# Patient Record
Sex: Male | Born: 1990
Health system: Southern US, Community
[De-identification: ages and names within clinical notes are randomized; demographics above are authoritative.]

## PROBLEM LIST (undated history)

## (undated) DIAGNOSIS — I10 Essential (primary) hypertension: Secondary | ICD-10-CM

## (undated) DIAGNOSIS — R600 Localized edema: Secondary | ICD-10-CM

## (undated) DIAGNOSIS — G4733 Obstructive sleep apnea (adult) (pediatric): Secondary | ICD-10-CM

## (undated) DIAGNOSIS — M549 Dorsalgia, unspecified: Secondary | ICD-10-CM

## (undated) DIAGNOSIS — K760 Fatty (change of) liver, not elsewhere classified: Secondary | ICD-10-CM

## (undated) DIAGNOSIS — Z9989 Dependence on other enabling machines and devices: Secondary | ICD-10-CM

## (undated) HISTORY — DX: Dorsalgia, unspecified: M54.9

## (undated) HISTORY — DX: Localized edema: R60.0

## (undated) HISTORY — DX: Essential (primary) hypertension: I10

## (undated) HISTORY — DX: Fatty (change of) liver, not elsewhere classified: K76.0

## (undated) HISTORY — DX: Obstructive sleep apnea (adult) (pediatric): G47.33

## (undated) HISTORY — PX: APPENDECTOMY: SHX54

## (undated) HISTORY — DX: Dependence on other enabling machines and devices: Z99.89

## (undated) HISTORY — DX: Morbid (severe) obesity due to excess calories: E66.01

---

## 2015-01-11 ENCOUNTER — Encounter (HOSPITAL_BASED_OUTPATIENT_CLINIC_OR_DEPARTMENT_OTHER): Payer: Self-pay | Admitting: *Deleted

## 2015-01-11 ENCOUNTER — Emergency Department (HOSPITAL_BASED_OUTPATIENT_CLINIC_OR_DEPARTMENT_OTHER)
Admission: EM | Admit: 2015-01-11 | Discharge: 2015-01-11 | Disposition: A | Discharge status: Home / Self Care | Payer: BLUE CROSS/BLUE SHIELD | Attending: Emergency Medicine | Admitting: Emergency Medicine

## 2015-01-11 DIAGNOSIS — H9209 Otalgia, unspecified ear: Secondary | ICD-10-CM | POA: Insufficient documentation

## 2015-01-11 DIAGNOSIS — J069 Acute upper respiratory infection, unspecified: Secondary | ICD-10-CM | POA: Insufficient documentation

## 2015-01-11 DIAGNOSIS — R05 Cough: Secondary | ICD-10-CM | POA: Diagnosis present

## 2015-01-11 NOTE — ED Provider Notes (Signed)
CSN: [id number]     Arrival date & time 01/11/15  1236 History   First MD Initiated Contact with Patient 01/11/15 1243     Chief Complaint  Patient presents with  . URI     (Consider location/radiation/quality/duration/timing/severity/associated sxs/prior Treatment) HPI Comments: 24 year old male presenting with gradually worsening nasal congestion, nonproductive cough, itchy throat and bilateral itchy ears 2 days. No fevers. Tried over-the-counter Mucinex with minimal relief. No sick contacts. Took 2 Benadryl with relief of the itchy throat and ears. No aggravating factors.  Patient is a 24 y.o. male presenting with URI. The history is provided by the patient.  URI Presenting symptoms: congestion, cough, ear pain and sore throat     History reviewed. No pertinent past medical history. Past Surgical History  Procedure Laterality Date  . Appendectomy     History reviewed. No pertinent family history. Social History  Substance Use Topics  . Smoking status: Never Smoker   . Smokeless tobacco: None  . Alcohol Use: No    Review of Systems  HENT: Positive for congestion, ear pain and sore throat.   Respiratory: Positive for cough.   All other systems reviewed and are negative.     Allergies  Review of patient's allergies indicates no known allergies.  Home Medications   Prior to Admission medications   Not on File   BP 161/101 mmHg  Pulse 99  Temp(Src) 98.7 F (37.1 C) (Oral)  Resp 20  Ht  (1.626 m)  Wt 260 lb (117.935 kg)  BMI 44.61 kg/m2  SpO2 99% Physical Exam  Constitutional: He is oriented to person, place, and time. He appears well-developed and well-nourished. No distress.  HENT:  Head: Normocephalic and atraumatic.  Nasal congestion, mucosal edema, post nasal drip. BL TM normal.  Eyes: Conjunctivae and EOM are normal.  Neck: Normal range of motion. Neck supple.  Cardiovascular: Normal rate, regular rhythm and normal heart sounds.    Pulmonary/Chest: Effort normal and breath sounds normal.  Musculoskeletal: Normal range of motion. He exhibits no edema.  Lymphadenopathy:    He has no cervical adenopathy.  Neurological: He is alert and oriented to person, place, and time.  Skin: Skin is warm and dry.  Psychiatric: He has a normal mood and affect. His behavior is normal.  Nursing note and vitals reviewed.   ED Course  Procedures (including critical care time) Labs Review Labs Reviewed - No data to display  Imaging Review No results found. I have personally reviewed and evaluated these images and lab results as part of my medical decision-making.   EKG Interpretation None      MDM   Final diagnoses:  URI (upper respiratory infection)   NAD. Afebrile. Lungs clear. Oropharynx clear other than PND. Discussed symptomatic treatment. Stable for d/c. Return precautions given. Patient states understanding of treatment care plan and is agreeable.  Kathrynn Speed, PA-C 01/11/15 1322  Rolan Bucco, MD 01/11/15 1430

## 2015-01-11 NOTE — ED Notes (Signed)
pa at bedside. 

## 2015-01-11 NOTE — Discharge Instructions (Signed)
You may use over-the-counter nasal saline and Flonase. Continue taking Mucinex as needed. Rest and stay well-hydrated.  Upper Respiratory Infection, Adult An upper respiratory infection (URI) is also sometimes known as the common cold. The upper respiratory tract includes the nose, sinuses, throat, trachea, and bronchi. Bronchi are the airways leading to the lungs. Most people improve within 1 week, but symptoms can last up to 2 weeks. A residual cough may last even longer.  CAUSES Many different viruses can infect the tissues lining the upper respiratory tract. The tissues become irritated and inflamed and often become very moist. Mucus production is also common. A cold is contagious. You can easily spread the virus to others by oral contact. This includes kissing, sharing a glass, coughing, or sneezing. Touching your mouth or nose and then touching a surface, which is then touched by another person, can also spread the virus. SYMPTOMS  Symptoms typically develop 1 to 3 days after you come in contact with a cold virus. Symptoms vary from person to person. They may include:  Runny nose.  Sneezing.  Nasal congestion.  Sinus irritation.  Sore throat.  Loss of voice (laryngitis).  Cough.  Fatigue.  Muscle aches.  Loss of appetite.  Headache.  Low-grade fever. DIAGNOSIS  You might diagnose your own cold based on familiar symptoms, since most people get a cold 2 to 3 times a year. Your caregiver can confirm this based on your exam. Most importantly, your caregiver can check that your symptoms are not due to another disease such as strep throat, sinusitis, pneumonia, asthma, or epiglottitis. Blood tests, throat tests, and X-rays are not necessary to diagnose a common cold, but they may sometimes be helpful in excluding other more serious diseases. Your caregiver will decide if any further tests are required. RISKS AND COMPLICATIONS  You may be at risk for a more severe case of the common  cold if you smoke cigarettes, have chronic heart disease (such as heart failure) or lung disease (such as asthma), or if you have a weakened immune system. The very young and very old are also at risk for more serious infections. Bacterial sinusitis, middle ear infections, and bacterial pneumonia can complicate the common cold. The common cold can worsen asthma and chronic obstructive pulmonary disease (COPD). Sometimes, these complications can require emergency medical care and may be life-threatening. PREVENTION  The best way to protect against getting a cold is to practice good hygiene. Avoid oral or hand contact with people with cold symptoms. Wash your hands often if contact occurs. There is no clear evidence that vitamin C, vitamin E, echinacea, or exercise reduces the chance of developing a cold. However, it is always recommended to get plenty of rest and practice good nutrition. TREATMENT  Treatment is directed at relieving symptoms. There is no cure. Antibiotics are not effective, because the infection is caused by a virus, not by bacteria. Treatment may include:  Increased fluid intake. Sports drinks offer valuable electrolytes, sugars, and fluids.  Breathing heated mist or steam (vaporizer or shower).  Eating chicken soup or other clear broths, and maintaining good nutrition.  Getting plenty of rest.  Using gargles or lozenges for comfort.  Controlling fevers with ibuprofen or acetaminophen as directed by your caregiver.  Increasing usage of your inhaler if you have asthma. Zinc gel and zinc lozenges, taken in the first 24 hours of the common cold, can shorten the duration and lessen the severity of symptoms. Pain medicines may help with fever, muscle aches,  and throat pain. A variety of non-prescription medicines are available to treat congestion and runny nose. Your caregiver can make recommendations and may suggest nasal or lung inhalers for other symptoms.  HOME CARE INSTRUCTIONS     Only take over-the-counter or prescription medicines for pain, discomfort, or fever as directed by your caregiver.  Use a warm mist humidifier or inhale steam from a shower to increase air moisture. This may keep secretions moist and make it easier to breathe.  Drink enough water and fluids to keep your urine clear or pale yellow.  Rest as needed.  Return to work when your temperature has returned to normal or as your caregiver advises. You may need to stay home longer to avoid infecting others. You can also use a face mask and careful hand washing to prevent spread of the virus. SEEK MEDICAL CARE IF:   After the first few days, you feel you are getting worse rather than better.  You need your caregiver's advice about medicines to control symptoms.  You develop chills, worsening shortness of breath, or brown or red sputum. These may be signs of pneumonia.  You develop yellow or brown nasal discharge or pain in the face, especially when you bend forward. These may be signs of sinusitis.  You develop a fever, swollen neck glands, pain with swallowing, or white areas in the back of your throat. These may be signs of strep throat. SEEK IMMEDIATE MEDICAL CARE IF:   You have a fever.  You develop severe or persistent headache, ear pain, sinus pain, or chest pain.  You develop wheezing, a prolonged cough, cough up blood, or have a change in your usual mucus (if you have chronic lung disease).  You develop sore muscles or a stiff neck. Document Released: 10/20/2000 Document Revised: 07/19/2011 Document Reviewed: 08/01/2013 [facility]Exit[facility] Patient Information 2015 [employer], [place]. This information is not intended to replace advice given to you by your health care provider. Make sure you discuss any questions you have with your health care provider.

## 2015-01-11 NOTE — ED Notes (Signed)
Pt c/o URI symptoms x 3 days 

## 2016-02-10 DIAGNOSIS — Z23 Encounter for immunization: Secondary | ICD-10-CM | POA: Diagnosis not present

## 2016-04-28 DIAGNOSIS — I10 Essential (primary) hypertension: Secondary | ICD-10-CM | POA: Diagnosis not present

## 2016-04-28 DIAGNOSIS — J209 Acute bronchitis, unspecified: Secondary | ICD-10-CM | POA: Diagnosis not present

## 2016-08-16 ENCOUNTER — Encounter: Payer: Self-pay | Admitting: Family Medicine

## 2016-08-16 ENCOUNTER — Ambulatory Visit (INDEPENDENT_AMBULATORY_CARE_PROVIDER_SITE_OTHER): Payer: BLUE CROSS/BLUE SHIELD | Admitting: Family Medicine

## 2016-08-16 ENCOUNTER — Other Ambulatory Visit: Payer: Self-pay | Admitting: Family Medicine

## 2016-08-16 VITALS — BP 130/90 | HR 93 | Ht 65.5 in | Wt 258.6 lb

## 2016-08-16 DIAGNOSIS — I10 Essential (primary) hypertension: Secondary | ICD-10-CM | POA: Diagnosis not present

## 2016-08-16 DIAGNOSIS — R0683 Snoring: Secondary | ICD-10-CM | POA: Diagnosis not present

## 2016-08-16 DIAGNOSIS — R400 Somnolence: Secondary | ICD-10-CM | POA: Diagnosis not present

## 2016-08-16 DIAGNOSIS — Z7689 Persons encountering health services in other specified circumstances: Secondary | ICD-10-CM

## 2016-08-16 LAB — CBC WITH DIFFERENTIAL/PLATELET
Basophils Absolute: 0 cells/uL (ref 0–200)
Basophils Relative: 0 %
Eosinophils Absolute: 146 cells/uL (ref 15–500)
Eosinophils Relative: 2 %
HCT: 48.4 % (ref 38.5–50.0)
Hemoglobin: 17 g/dL (ref 13.2–17.1)
Lymphocytes Relative: 32 %
Lymphs Abs: 2336 cells/uL (ref 850–3900)
MCH: 30.6 pg (ref 27.0–33.0)
MCHC: 35.1 g/dL (ref 32.0–36.0)
MCV: 87.2 fL (ref 80.0–100.0)
MPV: 10.5 fL (ref 7.5–12.5)
Monocytes Absolute: 438 cells/uL (ref 200–950)
Monocytes Relative: 6 %
Neutro Abs: 4380 cells/uL (ref 1500–7800)
Neutrophils Relative %: 60 %
Platelets: 208 10*3/uL (ref 140–400)
RBC: 5.55 MIL/uL (ref 4.20–5.80)
RDW: 13.9 % (ref 11.0–15.0)
WBC: 7.3 10*3/uL (ref 4.0–10.5)

## 2016-08-16 LAB — COMPREHENSIVE METABOLIC PANEL
ALT: 66 U/L — ABNORMAL HIGH (ref 9–46)
AST: 29 U/L (ref 10–40)
Albumin: 4.9 g/dL (ref 3.6–5.1)
Alkaline Phosphatase: 106 U/L (ref 40–115)
BUN: 16 mg/dL (ref 7–25)
CO2: 29 mmol/L (ref 20–31)
Calcium: 9.6 mg/dL (ref 8.6–10.3)
Chloride: 102 mmol/L (ref 98–110)
Creat: 0.87 mg/dL (ref 0.60–1.35)
Glucose, Bld: 114 mg/dL — ABNORMAL HIGH (ref 65–99)
Potassium: 3.7 mmol/L (ref 3.5–5.3)
Sodium: 138 mmol/L (ref 135–146)
Total Bilirubin: 0.5 mg/dL (ref 0.2–1.2)
Total Protein: 7.9 g/dL (ref 6.1–8.1)

## 2016-08-16 NOTE — Patient Instructions (Addendum)
Buy a blood pressure cuff that fits your upper arm.  Check your blood pressure 2-3 times per week and write these readings down.  Goal BP range is <130/80  Watch your salt intake. Eat a healthy diet, more fruits and vegetables and whole grains and less fried and fatty foods. Fast food, fried food, and even some frozen meals have a lot of sodium in them.  Start getting at least 150 minutes of physical activity per week outside of your normal activities.   We will call you with lab results and start you back on medication for your blood pressure.   Follow up in 1 month.    DASH Eating Plan DASH stands for "Dietary Approaches to Stop Hypertension." The DASH eating plan is a healthy eating plan that has been shown to reduce high blood pressure (hypertension). It may also reduce your risk for type 2 diabetes, heart disease, and stroke. The DASH eating plan may also help with weight loss. What are tips for following this plan? General guidelines   Avoid eating more than 2,300 mg (milligrams) of salt (sodium) a day. If you have hypertension, you may need to reduce your sodium intake to 1,500 mg a day.  Limit alcohol intake to no more than 1 drink a day for nonpregnant women and 2 drinks a day for men. One drink equals 12 oz of beer, 5 oz of wine, or 1 oz of hard liquor.  Work with your health care provider to maintain a healthy body weight or to lose weight. Ask what an ideal weight is for you.  Get at least 30 minutes of exercise that causes your heart to beat faster (aerobic exercise) most days of the week. Activities may include walking, swimming, or biking.  Work with your health care provider or diet and nutrition specialist (dietitian) to adjust your eating plan to your individual calorie needs. Reading food labels   Check food labels for the amount of sodium per serving. Choose foods with less than 5 percent of the Daily Value of sodium. Generally, foods with less than 300 mg of sodium  per serving fit into this eating plan.  To find whole grains, look for the word "whole" as the first word in the ingredient list. Shopping   Buy products labeled as "low-sodium" or "no salt added."  Buy fresh foods. Avoid canned foods and premade or frozen meals. Cooking   Avoid adding salt when cooking. Use salt-free seasonings or herbs instead of table salt or sea salt. Check with your health care provider or pharmacist before using salt substitutes.  Do not fry foods. Cook foods using healthy methods such as baking, boiling, grilling, and broiling instead.  Cook with heart-healthy oils, such as olive, canola, soybean, or sunflower oil. Meal planning    Eat a balanced diet that includes:  5 or more servings of fruits and vegetables each day. At each meal, try to fill half of your plate with fruits and vegetables.  Up to 6-8 servings of whole grains each day.  Less than 6 oz of lean meat, poultry, or fish each day. A 3-oz serving of meat is about the same size as a deck of cards. One egg equals 1 oz.  2 servings of low-fat dairy each day.  A serving of nuts, seeds, or beans 5 times each week.  Heart-healthy fats. Healthy fats called Omega-3 fatty acids are found in foods such as flaxseeds and coldwater fish, like sardines, salmon, and mackerel.  Limit how  much you eat of the following:  Canned or prepackaged foods.  Food that is high in trans fat, such as fried foods.  Food that is high in saturated fat, such as fatty meat.  Sweets, desserts, sugary drinks, and other foods with added sugar.  Full-fat dairy products.  Do not salt foods before eating.  Try to eat at least 2 vegetarian meals each week.  Eat more home-cooked food and less restaurant, buffet, and fast food.  When eating at a restaurant, ask that your food be prepared with less salt or no salt, if possible. What foods are recommended? The items listed may not be a complete list. Talk with your dietitian  about what dietary choices are best for you. Grains  Whole-grain or whole-wheat bread. Whole-grain or whole-wheat pasta. Brown rice. Orpah Cobb. Bulgur. Whole-grain and low-sodium cereals. Pita bread. Low-fat, low-sodium crackers. Whole-wheat flour tortillas. Vegetables  Fresh or frozen vegetables (raw, steamed, roasted, or grilled). Low-sodium or reduced-sodium tomato and vegetable juice. Low-sodium or reduced-sodium tomato sauce and tomato paste. Low-sodium or reduced-sodium canned vegetables. Fruits  All fresh, dried, or frozen fruit. Canned fruit in natural juice (without added sugar). Meat and other protein foods  Skinless chicken or [place]. Ground chicken or [place]. Pork with fat trimmed off. Fish and seafood. Egg whites. Dried beans, peas, or lentils. Unsalted nuts, nut butters, and seeds. Unsalted canned beans. Lean cuts of beef with fat trimmed off. Low-sodium, lean deli meat. Dairy  Low-fat (1%) or fat-free (skim) milk. Fat-free, low-fat, or reduced-fat cheeses. Nonfat, low-sodium ricotta or cottage cheese. Low-fat or nonfat yogurt. Low-fat, low-sodium cheese. Fats and oils  Soft margarine without trans fats. Vegetable oil. Low-fat, reduced-fat, or light mayonnaise and salad dressings (reduced-sodium). Canola, safflower, olive, soybean, and sunflower oils. Avocado. Seasoning and other foods  Herbs. Spices. Seasoning mixes without salt. Unsalted popcorn and pretzels. Fat-free sweets. What foods are not recommended? The items listed may not be a complete list. Talk with your dietitian about what dietary choices are best for you. Grains  Baked goods made with fat, such as croissants, muffins, or some breads. Dry pasta or rice meal packs. Vegetables  Creamed or fried vegetables. Vegetables in a cheese sauce. Regular canned vegetables (not low-sodium or reduced-sodium). Regular canned tomato sauce and paste (not low-sodium or reduced-sodium). Regular tomato and vegetable juice (not  low-sodium or reduced-sodium). Rosita Fire. Olives. Fruits  Canned fruit in a light or heavy syrup. Fried fruit. Fruit in cream or butter sauce. Meat and other protein foods  Fatty cuts of meat. Ribs. Fried meat. Tomasa Blase. Sausage. Bologna and other processed lunch meats. Salami. Fatback. Hotdogs. Bratwurst. Salted nuts and seeds. Canned beans with added salt. Canned or smoked fish. Whole eggs or egg yolks. Chicken or [place] with skin. Dairy  Whole or 2% milk, cream, and half-and-half. Whole or full-fat cream cheese. Whole-fat or sweetened yogurt. Full-fat cheese. Nondairy creamers. Whipped toppings. Processed cheese and cheese spreads. Fats and oils  Butter. Stick margarine. Lard. Shortening. Ghee. Bacon fat. Tropical oils, such as coconut, palm kernel, or palm oil. Seasoning and other foods  Salted popcorn and pretzels. Onion salt, garlic salt, seasoned salt, table salt, and sea salt. Worcestershire sauce. Tartar sauce. Barbecue sauce. Teriyaki sauce. Soy sauce, including reduced-sodium. Steak sauce. Canned and packaged gravies. Fish sauce. Oyster sauce. Cocktail sauce. Horseradish that you find on the shelf. Ketchup. Mustard. Meat flavorings and tenderizers. Bouillon cubes. Hot sauce and Tabasco sauce. Premade or packaged marinades. Premade or packaged taco seasonings. Relishes. Regular salad  dressings. Where to find more information:  National Heart, Lung, and Blood Institute: PopSteam.is  American Heart Association: www.heart.org Summary  The DASH eating plan is a healthy eating plan that has been shown to reduce high blood pressure (hypertension). It may also reduce your risk for type 2 diabetes, heart disease, and stroke.  With the DASH eating plan, you should limit salt (sodium) intake to 2,300 mg a day. If you have hypertension, you may need to reduce your sodium intake to 1,500 mg a day.  When on the DASH eating plan, aim to eat more fresh fruits and vegetables, whole grains, lean  proteins, low-fat dairy, and heart-healthy fats.  Work with your health care provider or diet and nutrition specialist (dietitian) to adjust your eating plan to your individual calorie needs. This information is not intended to replace advice given to you by your health care provider. Make sure you discuss any questions you have with your health care provider. Document Released: 04/15/2011 Document Revised: 04/19/2016 Document Reviewed: 04/19/2016 Elsevier Interactive Patient Education  2017 [employer].

## 2016-08-16 NOTE — Progress Notes (Signed)
Subjective:    Patient ID: Malik Jackson, male    DOB: 05-30-1990, 26 y.o.   MRN: [id number]  HPI Chief Complaint  Patient presents with  . BP issues    diagnosis 3 years ago with HTN. seeing someone in [place] but then he moved away and now wants to find someone on this side of town. been out of meds couple months   He is new to the practice. Here to establish care and for complaints of HTN. States he has been out of his BP medication for a couple of months.  He does not check his BP at home. Does not have a BP cuff.   States he has taken Lisinopril in the past but was switched to amlodipine. He is not sure why he was switched, denies having any side effects from either medication.    States he thinks he may have sleep apnea. States his wife has told him he snores loudly, he feels sleepy during the day often. He also works night shift so his sleep pattern is sporadic. Denies having a sleep study in the past.   Denies fever, chills, fatigue, unexplained weight loss, dizziness, headache, chest pain, palpitations, shortness of breath, orthopnea, abdominal pain, N/V/D, LE edema.   Previous medical care: [facility]. No PCP in years.   Last CPE: 2-3 years ago.   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  Denies smoking, 2-3 beers every 2-3 days, denies drug use Diet: fairly healthy  Exercise: nothing outside of work.   Reviewed allergies, medications, past medical, surgical, family, and social history.   Review of Systems Pertinent positives and negatives in the history of present illness.     Objective:   Physical Exam  Constitutional: He is oriented to person, place, and time. He appears well-developed and well-nourished. No distress.  HENT:  Mouth/Throat: Oropharynx is clear and moist.  Eyes: Conjunctivae are normal. Pupils are equal,  round, and reactive to light.  Neck: Normal range of motion. Neck supple. No JVD present. No thyromegaly present.  Cardiovascular: Normal rate, regular rhythm, normal heart sounds and intact distal pulses.  Exam reveals no gallop and no friction rub.   No murmur heard. Pulmonary/Chest: Effort normal and breath sounds normal.  Lymphadenopathy:    He has no cervical adenopathy.  Neurological: He is alert and oriented to person, place, and time. He has normal reflexes. No cranial nerve deficit. Coordination normal.  Skin: Skin is warm and dry. No pallor.  Psychiatric: He has a normal mood and affect. His behavior is normal. Thought content normal.   BP 130/90   Pulse 93   Ht 5' 5.5" (1.664 m)   Wt 258 lb 9.6 oz (117.3 kg)   BMI 42.38 kg/m      Assessment & Plan:  Encounter to establish care  Hypertension, unspecified type - Plan: CBC with Differential/Platelet, Comprehensive metabolic panel  Snoring - Plan: Split night study  Daytime sleepiness - Plan: Split night study  Morbid obesity (HCC)  Discussed that we will put him back on BP medication. I will check his blood work and if he has diabetes then I will start him back on lisinopril. He has taken both lisinopril and amlodipine in the past at different times without any issues.  Advised him to buy a BP cuff and counseled on correct way to check  BP.  Counseled on DASH diet.  Discussed potential risks morbid obesity poses to his health and counseled on healthy lifestyle modifications.  He is at risk for sleep apnea and he does have snoring and daytime sleepiness.  epworth sleepiness scale is 10. I will refer him for a sleep study.  Sleep apnea may be contributing to elevated BP as well.  Follow up pending labs and start him back on BP medication. Will have him follow up if BP not improving on medication and in 1 month for a follow up.  He will need a CPE and fasting labs soon.

## 2016-08-17 LAB — HEPATITIS PANEL, ACUTE
HCV Ab: NEGATIVE
Hep A IgM: NONREACTIVE
Hep B C IgM: NONREACTIVE
Hepatitis B Surface Ag: NEGATIVE

## 2016-08-18 ENCOUNTER — Other Ambulatory Visit: Payer: Self-pay | Admitting: Family Medicine

## 2016-08-18 DIAGNOSIS — I10 Essential (primary) hypertension: Secondary | ICD-10-CM

## 2016-08-18 LAB — HEMOGLOBIN A1C
Hgb A1c MFr Bld: 5.3 % (ref <5.7)
Mean Plasma Glucose: 105 mg/dL

## 2016-08-18 MED ORDER — AMLODIPINE BESYLATE 5 MG PO TABS: 5.0000 mg | ORAL_TABLET | Freq: Every day | ORAL | 2 refills | Status: DC

## 2016-08-25 ENCOUNTER — Other Ambulatory Visit: Payer: Self-pay | Admitting: Family Medicine

## 2016-08-25 DIAGNOSIS — R400 Somnolence: Secondary | ICD-10-CM

## 2016-08-25 DIAGNOSIS — R0683 Snoring: Secondary | ICD-10-CM

## 2016-09-21 NOTE — Progress Notes (Signed)
Subjective:    Patient ID: Malik Jackson, male    DOB: Jul 08, 1990, 26 y.o.   MRN: [id number]  HPI Chief Complaint  Patient presents with  . fasitng cpe    fasting cpe. no other concerns   He is here for a complete physical exam. Previous medical care: [facility]. No PCP in years.  Last CPE: years ago.   Other providers: none    HTN- diagnosed 3 years ago. He was out of BP medication for a couple of months at our last visit and has since started back on medication  Sates he has not been checking BP at home Has sleep study scheduled for possible sleep apnea next month.  Social history: Lives with wife and 2 kids ages 573 and 2, works at [employer].  Denies smoking, 2-3 beers every 2-3 days, denies drug use Diet: fairly healthy  Exercise: nothing outside of work.   Immunizations: Tdap 10 years. Due this year.   Health maintenance:  Colonoscopy: N/A Last PSA: N/A Last Dental Exam: dental appointment tomorrow.  Last Eye Exam: years, no issues.   Wears seatbelt always, uses sunscreen, smoke detectors in home and functioning, does not text while driving, feels safe in home environment.   Reviewed allergies, medications, past medical, surgical, family, and social history.    Review of Systems Review of Systems Constitutional: -fever, -chills, -sweats, -unexpected weight change,-fatigue ENT: -runny nose, -ear pain, -sore throat Cardiology:  -chest pain, -palpitations, -edema Respiratory: -cough, -shortness of breath, -wheezing Gastroenterology: -abdominal pain, -nausea, -vomiting, -diarrhea, -constipation  Hematology: -bleeding or bruising problems Musculoskeletal: -arthralgias, -myalgias, -joint swelling, -back pain Ophthalmology: -vision changes Urology: -dysuria, -difficulty urinating, -hematuria, -urinary frequency, -urgency Neurology: -headache, -weakness, -tingling, -numbness       Objective:   Physical Exam BP 122/82   Pulse 86   Ht 5'  6" (1.676 m)   Wt 256 lb 12.8 oz (116.5 kg)   BMI 41.45 kg/m   General Appearance:    Alert, cooperative, no distress, appears stated age  Head:    Normocephalic, without obvious abnormality, atraumatic  Eyes:    PERRL, conjunctiva/corneas clear, EOM's intact, fundi    benign  Ears:    Normal TM's and external ear canals  Nose:   Nares normal, mucosa normal, no drainage or sinus   tenderness  Throat:   Lips, mucosa, and tongue normal; teeth and gums normal  Neck:   Supple, no lymphadenopathy;  thyroid:  no   enlargement/tenderness/nodules; no carotid   bruit or JVD  Back:    Spine nontender, no curvature, ROM normal, no CVA     tenderness  Lungs:     Clear to auscultation bilaterally without wheezes, rales or     ronchi; respirations unlabored  Chest Wall:    No tenderness or deformity   Heart:    Regular rate and rhythm, S1 and S2 normal, no murmur, rub   or gallop  Breast Exam:    No chest wall tenderness, masses or gynecomastia  Abdomen:     Soft, non-tender, nondistended, normoactive bowel sounds,    no masses, no hepatosplenomegaly  Genitalia:    Normal uncircumcised male external genitalia without lesions.  Testicles without masses.  No inguinal hernias.  Rectal:   Deferred due to age <40 and lack of symptoms  Extremities:   No clubbing, cyanosis or edema  Pulses:   2+ and symmetric all extremities  Skin:   Skin color, texture, turgor normal, no rashes or  lesions  Lymph nodes:   Cervical, supraclavicular, and axillary nodes normal  Neurologic:   CNII-XII intact, normal strength, sensation and gait; reflexes 2+ and symmetric throughout          Psych:   Normal mood, affect, hygiene and grooming.     Urinalysis dipstick: neg     Assessment & Plan:  Routine general medical examination at a health care facility - Plan: Urinalysis Dipstick, Comprehensive metabolic panel, TSH, Lipid panel  Hypertension, unspecified type - Plan: Comprehensive metabolic panel  Morbid obesity  (HCC) - Plan: TSH, Lipid panel  Need for Tdap vaccination - Plan: Tdap vaccine greater than or equal to 7yo IM  Screening for STD (sexually transmitted disease) - Plan: RPR, HIV antibody, GC/Chlamydia Probe Amp  Elevated LFTs - Plan: Comprehensive metabolic panel  Counseled on healthy lifestyle including diet and exercise. Discussed that weight loss will help with HTN also. Advised that morbid obesity has potential increased health risks.  BP is close to goal range. Continue on current medication. Will have him buy a BP cuff and check his BP cuff 2-3 days a week. He will report back elevated readings.  Tdap given.  STD screening done per patient request.  He has cut back on alcohol use and we will recheck his LFTs.  Follow up pending labs and upcoming sleep study results.

## 2016-09-22 ENCOUNTER — Ambulatory Visit (INDEPENDENT_AMBULATORY_CARE_PROVIDER_SITE_OTHER): Payer: BLUE CROSS/BLUE SHIELD | Admitting: Family Medicine

## 2016-09-22 ENCOUNTER — Encounter: Payer: Self-pay | Admitting: Family Medicine

## 2016-09-22 VITALS — BP 122/82 | HR 86 | Ht 66.0 in | Wt 256.8 lb

## 2016-09-22 DIAGNOSIS — Z6841 Body Mass Index (BMI) 40.0 and over, adult: Secondary | ICD-10-CM | POA: Insufficient documentation

## 2016-09-22 DIAGNOSIS — Z0000 Encounter for general adult medical examination without abnormal findings: Secondary | ICD-10-CM | POA: Diagnosis not present

## 2016-09-22 DIAGNOSIS — Z113 Encounter for screening for infections with a predominantly sexual mode of transmission: Secondary | ICD-10-CM

## 2016-09-22 DIAGNOSIS — R7989 Other specified abnormal findings of blood chemistry: Secondary | ICD-10-CM

## 2016-09-22 DIAGNOSIS — I10 Essential (primary) hypertension: Secondary | ICD-10-CM

## 2016-09-22 DIAGNOSIS — Z23 Encounter for immunization: Secondary | ICD-10-CM

## 2016-09-22 DIAGNOSIS — R945 Abnormal results of liver function studies: Secondary | ICD-10-CM

## 2016-09-22 LAB — COMPREHENSIVE METABOLIC PANEL
ALT: 80 U/L — ABNORMAL HIGH (ref 9–46)
AST: 35 U/L (ref 10–40)
Albumin: 4.7 g/dL (ref 3.6–5.1)
Alkaline Phosphatase: 104 U/L (ref 40–115)
BUN: 12 mg/dL (ref 7–25)
CO2: 27 mmol/L (ref 20–31)
Calcium: 9.4 mg/dL (ref 8.6–10.3)
Chloride: 102 mmol/L (ref 98–110)
Creat: 0.94 mg/dL (ref 0.60–1.35)
Glucose, Bld: 106 mg/dL — ABNORMAL HIGH (ref 65–99)
Potassium: 3.9 mmol/L (ref 3.5–5.3)
Sodium: 139 mmol/L (ref 135–146)
Total Bilirubin: 0.5 mg/dL (ref 0.2–1.2)
Total Protein: 7.6 g/dL (ref 6.1–8.1)

## 2016-09-22 LAB — LIPID PANEL
Cholesterol: 210 mg/dL — ABNORMAL HIGH (ref <200)
HDL: 31 mg/dL — ABNORMAL LOW (ref >40)
LDL Cholesterol: 106 mg/dL — ABNORMAL HIGH (ref <100)
Total CHOL/HDL Ratio: 6.8 Ratio — ABNORMAL HIGH (ref <5.0)
Triglycerides: 366 mg/dL — ABNORMAL HIGH (ref <150)
VLDL: 73 mg/dL — ABNORMAL HIGH (ref <30)

## 2016-09-22 LAB — POCT URINALYSIS DIPSTICK
Bilirubin, UA: NEGATIVE
Blood, UA: NEGATIVE
Glucose, UA: NEGATIVE
Ketones, UA: NEGATIVE
Leukocytes, UA: NEGATIVE
Nitrite, UA: NEGATIVE
Protein, UA: NEGATIVE
Spec Grav, UA: >=1.03 — AB (ref 1.010–1.025)
Urobilinogen, UA: NEGATIVE E.U./dL — AB
pH, UA: 6 (ref 5.0–8.0)

## 2016-09-22 LAB — TSH: TSH: 1.69 mIU/L (ref 0.40–4.50)

## 2016-09-22 NOTE — Patient Instructions (Addendum)
You received your Tdap today. This is good for 10 years.  Buy a blood pressure cuff and check your blood pressure at least 2-3 times weekly.  If your readings are >130/80 on a regular basis then call me.  We will call you with your results.    Preventative Care for Adults, Male       REGULAR HEALTH EXAMS:  A routine yearly physical is a good way to check in with your primary care provider about your health and preventive screening. It is also an opportunity to share updates about your health and any concerns you have, and receive a thorough all-over exam.   Most health insurance companies pay for at least some preventative services.  Check with your health plan for specific coverages.  WHAT PREVENTATIVE SERVICES DO MEN NEED?  Adult men should have their weight and blood pressure checked regularly.   Men age 22 and older should have their cholesterol levels checked regularly.  Beginning at age 73 and continuing to age 1, men should be screened for colorectal cancer.  Certain people should may need continued testing until age 66.  Other cancer screening may include exams for testicular and prostate cancer.  Updating vaccinations is part of preventative care.  Vaccinations help protect against diseases such as the flu.  Lab tests are generally done as part of preventative care to screen for anemia and blood disorders, to screen for problems with the kidneys and liver, to screen for bladder problems, to check blood sugar, and to check your cholesterol level.  Preventative services generally include counseling about diet, exercise, avoiding tobacco, drugs, excessive alcohol consumption, and sexually transmitted infections.    GENERAL RECOMMENDATIONS FOR GOOD HEALTH:  Healthy diet:  Eat a variety of foods, including fruit, vegetables, animal or vegetable protein, such as meat, fish, chicken, and eggs, or beans, lentils, tofu, and grains, such as rice.  Drink plenty of water  daily.  Decrease saturated fat in the diet, avoid lots of red meat, processed foods, sweets, fast foods, and fried foods.  Exercise:  Aerobic exercise helps maintain good heart health. At least 30-40 minutes of moderate-intensity exercise is recommended. For example, a brisk walk that increases your heart rate and breathing. This should be done on most days of the week.   Find a type of exercise or a variety of exercises that you enjoy so that it becomes a part of your daily life.  Examples are running, walking, swimming, water aerobics, and biking.  For motivation and support, explore group exercise such as aerobic class, spin class, Zumba, Yoga,or  martial arts, etc.    Set exercise goals for yourself, such as a certain weight goal, walk or run in a race such as a 5k walk/run.  Speak to your primary care provider about exercise goals.  Disease prevention:  If you smoke or chew tobacco, find out from your caregiver how to quit. It can literally save your life, no matter how long you have been a tobacco user. If you do not use tobacco, never begin.   Maintain a healthy diet and normal weight. Increased weight leads to problems with blood pressure and diabetes.   The Body Mass Index or BMI is a way of measuring how much of your body is fat. Having a BMI above 27 increases the risk of heart disease, diabetes, hypertension, stroke and other problems related to obesity. Your caregiver can help determine your BMI and based on it develop an exercise and dietary program  to help you achieve or maintain this important measurement at a healthful level.  High blood pressure causes heart and blood vessel problems.  Persistent high blood pressure should be treated with medicine if weight loss and exercise do not work.   Fat and cholesterol leaves deposits in your arteries that can block them. This causes heart disease and vessel disease elsewhere in your body.  If your cholesterol is found to be high, or if  you have heart disease or certain other medical conditions, then you may need to have your cholesterol monitored frequently and be treated with medication.   Ask if you should have a stress test if your history suggests this. A stress test is a test done on a treadmill that looks for heart disease. This test can find disease prior to there being a problem.  Avoid drinking alcohol in excess (more than two drinks per day).  Avoid use of street drugs. Do not share needles with anyone. Ask for professional help if you need assistance or instructions on stopping the use of alcohol, cigarettes, and/or drugs.  Brush your teeth twice a day with fluoride toothpaste, and floss once a day. Good oral hygiene prevents tooth decay and gum disease. The problems can be painful, unattractive, and can cause other health problems. Visit your dentist for a routine oral and dental check up and preventive care every 6-12 months.   Look at your skin regularly.  Use a mirror to look at your back. Notify your caregivers of changes in moles, especially if there are changes in shapes, colors, a size larger than a pencil eraser, an irregular border, or development of new moles.  Safety:  Use seatbelts 100% of the time, whether driving or as a passenger.  Use safety devices such as hearing protection if you work in environments with loud noise or significant background noise.  Use safety glasses when doing any work that could send debris in to the eyes.  Use a helmet if you ride a bike or motorcycle.  Use appropriate safety gear for contact sports.  Talk to your caregiver about gun safety.  Use sunscreen with a SPF (or skin protection factor) of 15 or greater.  Lighter skinned people are at a greater risk of skin cancer. Don't forget to also wear sunglasses in order to protect your eyes from too much damaging sunlight. Damaging sunlight can accelerate cataract formation.   Practice safe sex. Use condoms. Condoms are used for  birth control and to help reduce the spread of sexually transmitted infections (or STIs).  Some of the STIs are gonorrhea (the clap), chlamydia, syphilis, trichomonas, herpes, HPV (human papilloma virus) and HIV (human immunodeficiency virus) which causes AIDS. The herpes, HIV and HPV are viral illnesses that have no cure. These can result in disability, cancer and death.   Keep carbon monoxide and smoke detectors in your home functioning at all times. Change the batteries every 6 months or use a model that plugs into the wall.   Vaccinations:  Stay up to date with your tetanus shots and other required immunizations. You should have a booster for tetanus every 10 years. Be sure to get your flu shot every year, since 5%-20% of the [place] population comes down with the flu. The flu vaccine changes each year, so being vaccinated once is not enough. Get your shot in the fall, before the flu season peaks.   Other vaccines to consider:  Pneumococcal vaccine to protect against certain types of pneumonia.  This is normally recommended for adults age 28 or older.  However, adults younger than 28 years old with certain underlying conditions such as diabetes, heart or lung disease should also receive the vaccine.  Shingles vaccine to protect against Varicella Zoster if you are older than age 28, or younger than 28 years old with certain underlying illness.  Hepatitis A vaccine to protect against a form of infection of the liver by a virus acquired from food.  Hepatitis B vaccine to protect against a form of infection of the liver by a virus acquired from blood or body fluids, particularly if you work in health care.  If you plan to travel internationally, check with your local health department for specific vaccination recommendations.  Cancer Screening:  Most routine colon cancer screening begins at the age of 28. On a yearly basis, doctors may provide special easy to use take-home tests to check for hidden  blood in the stool. Sigmoidoscopy or colonoscopy can detect the earliest forms of colon cancer and is life saving. These tests use a small camera at the end of a tube to directly examine the colon. Speak to your caregiver about this at age 28, when routine screening begins (and is repeated every 5 years unless early forms of pre-cancerous polyps or small growths are found).   At the age of 28 men usually start screening for prostate cancer every year. Screening may begin at a younger age for those with higher risk. Those at higher risk include African-Americans or having a family history of prostate cancer. There are two types of tests for prostate cancer:   Prostate-specific antigen (PSA) testing. Recent studies raise questions about prostate cancer using PSA and you should discuss this with your caregiver.   Digital rectal exam (in which your doctor's lubricated and gloved finger feels for enlargement of the prostate through the anus).   Screening for testicular cancer.  Do a monthly exam of your testicles. Gently roll each testicle between your thumb and fingers, feeling for any abnormal lumps. The best time to do this is after a hot shower or bath when the tissues are looser. Notify your caregivers of any lumps, tenderness or changes in size or shape immediately.

## 2016-09-23 ENCOUNTER — Other Ambulatory Visit: Payer: Self-pay | Admitting: Family Medicine

## 2016-09-23 DIAGNOSIS — R7989 Other specified abnormal findings of blood chemistry: Secondary | ICD-10-CM

## 2016-09-23 DIAGNOSIS — R945 Abnormal results of liver function studies: Principal | ICD-10-CM

## 2016-09-23 LAB — GC/CHLAMYDIA PROBE AMP
CT Probe RNA: NOT DETECTED
GC Probe RNA: NOT DETECTED

## 2016-09-23 LAB — RPR

## 2016-09-23 LAB — HIV ANTIBODY (ROUTINE TESTING W REFLEX): HIV 1&2 Ab, 4th Generation: NONREACTIVE

## 2016-10-01 ENCOUNTER — Other Ambulatory Visit: Payer: BLUE CROSS/BLUE SHIELD

## 2016-10-11 ENCOUNTER — Other Ambulatory Visit: Payer: Self-pay | Admitting: Family Medicine

## 2016-10-11 ENCOUNTER — Ambulatory Visit
Admission: RE | Admit: 2016-10-11 | Discharge: 2016-10-11 | Disposition: A | Discharge status: Home / Self Care | Payer: BLUE CROSS/BLUE SHIELD | Source: Ambulatory Visit | Attending: Family Medicine | Admitting: Family Medicine

## 2016-10-11 DIAGNOSIS — R945 Abnormal results of liver function studies: Secondary | ICD-10-CM | POA: Diagnosis not present

## 2016-10-11 DIAGNOSIS — R7989 Other specified abnormal findings of blood chemistry: Secondary | ICD-10-CM

## 2016-10-12 ENCOUNTER — Ambulatory Visit (HOSPITAL_BASED_OUTPATIENT_CLINIC_OR_DEPARTMENT_OTHER): Payer: BLUE CROSS/BLUE SHIELD | Attending: Family Medicine | Admitting: Internal Medicine

## 2016-10-12 VITALS — Ht 65.0 in | Wt 250.0 lb

## 2016-10-12 DIAGNOSIS — R400 Somnolence: Secondary | ICD-10-CM | POA: Insufficient documentation

## 2016-10-12 DIAGNOSIS — R0683 Snoring: Secondary | ICD-10-CM | POA: Diagnosis not present

## 2016-10-12 DIAGNOSIS — G4733 Obstructive sleep apnea (adult) (pediatric): Secondary | ICD-10-CM

## 2016-10-23 DIAGNOSIS — R0683 Snoring: Secondary | ICD-10-CM | POA: Diagnosis not present

## 2016-10-23 NOTE — Procedures (Signed)
   Patient Name: Malik Jackson, Kamonte Study Date: 10/13/2016 Gender: Male D.O.B: 10/01/1990 Age (years): 26 Referring Provider: Avanell ShackletonVickie L Henson NP Height (inches): 65 Interpreting Physician: Jetty Duhamellinton Agamjot Kilgallon MD, ABSM Weight (lbs): 250 RPSGT: Reardan SinkBarksdale, [place] BMI: 42 MRN: [id number] Neck Size: 18.50 CLINICAL INFORMATION Sleep Study Type: unattended HST    Indication for sleep study: Daytime Fatigue, Snoring  Epworth Sleepiness Score: 6  SLEEP STUDY TECHNIQUE A multi-channel overnight portable sleep study was performed. The channels recorded were: nasal airflow, thoracic respiratory movement, and oxygen saturation with a pulse oximetry. Snoring was also monitored.  MEDICATIONS Patient self administered medications include: none reported during study.  SLEEP ARCHITECTURE Patient was studied for 329.5 minutes. The sleep efficiency was 97.5 % and the patient was supine for 90.9%. The arousal index was 0.0 per hour.  RESPIRATORY PARAMETERS The overall AHI was 37.7 per hour, with a central apnea index of 0.0 per hour.  The oxygen nadir was 84% during sleep.  CARDIAC DATA Mean heart rate during sleep was 77.0 bpm.  IMPRESSIONS - Severe obstructive sleep apnea occurred during this study (AHI = 37.7/h). - No significant central sleep apnea occurred during this study (CAI = 0.0/h). - Moderate oxygen desaturation was noted during this study (Min O2 = 84%, Mean 93%). - Patient snored  . DIAGNOSIS - Obstructive Sleep Apnea (327.23 [G47.33 ICD-10])  RECOMMENDATIONS - CPAP is usually intial therapy of  choice for scores in this range. Return for CPAP titration study if appropriate. Other options based on clinical judgment - Positional therapy avoiding supine position during sleep. - Avoid alcohol, sedatives and other CNS depressants that may worsen sleep apnea and disrupt normal sleep architecture. - Sleep hygiene should be reviewed to assess factors that may improve sleep quality. -  Weight management and regular exercise should be initiated or continued.  [Electronically signed] 10/23/2016 10:02 AM  Jetty Duhamellinton Aundreya Souffrant MD, ABSM Diplomate, American Board of Sleep Medicine   NPI: [id number]  Waymon BudgeYOUNG,Kelina Beauchamp D Diplomate, American Board of Sleep Medicine  ELECTRONICALLY SIGNED ON:  10/23/2016, 9:58 AM [facility] PH: [phone]   FX: [phone] ACCREDITED BY THE AMERICAN ACADEMY OF SLEEP MEDICINE

## 2016-10-25 ENCOUNTER — Telehealth: Payer: Self-pay | Admitting: Family Medicine

## 2016-10-25 DIAGNOSIS — I10 Essential (primary) hypertension: Secondary | ICD-10-CM

## 2016-10-25 MED ORDER — AMLODIPINE BESYLATE 5 MG PO TABS: 5.0000 mg | ORAL_TABLET | Freq: Every day | ORAL | 2 refills | Status: DC

## 2016-10-25 NOTE — Telephone Encounter (Signed)
Tried to call patient again but phone can not take calls at this time. I have refilled his meds

## 2016-10-25 NOTE — Telephone Encounter (Signed)
Ok to refill. He needs to come in to discuss sleep study results as you know.

## 2016-10-25 NOTE — Telephone Encounter (Signed)
Fax from [facility] [address]  Amlodipine tab 5mg  #90

## 2016-11-04 ENCOUNTER — Ambulatory Visit (INDEPENDENT_AMBULATORY_CARE_PROVIDER_SITE_OTHER): Payer: BLUE CROSS/BLUE SHIELD | Admitting: Family Medicine

## 2016-11-04 ENCOUNTER — Encounter: Payer: Self-pay | Admitting: Family Medicine

## 2016-11-04 VITALS — BP 130/82 | HR 77 | Wt 246.2 lb

## 2016-11-04 DIAGNOSIS — I10 Essential (primary) hypertension: Secondary | ICD-10-CM | POA: Diagnosis not present

## 2016-11-04 DIAGNOSIS — G4733 Obstructive sleep apnea (adult) (pediatric): Secondary | ICD-10-CM | POA: Diagnosis not present

## 2016-11-04 NOTE — Patient Instructions (Addendum)
As discussed, make sure you are using good sleep hygiene such as sleeping in a cool, dark room without lights or tv.  No alcohol or sedating medications. Do not lie flat, prop yourself on 2-3 pillows.  Follow up with me 1 month after using CPAP or sooner if you have concerns.   Bring in your BP readings to that appointment.    Sleep Apnea Sleep apnea is a condition in which breathing pauses or becomes shallow during sleep. Episodes of sleep apnea usually last 10 seconds or longer, and they may occur as many as 20 times an hour. Sleep apnea disrupts your sleep and keeps your body from getting the rest that it needs. This condition can increase your risk of certain health problems, including:  Heart attack.  Stroke.  Obesity.  Diabetes.  Heart failure.  Irregular heartbeat.  There are three kinds of sleep apnea:  Obstructive sleep apnea. This kind is caused by a blocked or collapsed airway.  Central sleep apnea. This kind happens when the part of the brain that controls breathing does not send the correct signals to the muscles that control breathing.  Mixed sleep apnea. This is a combination of obstructive and central sleep apnea.  What are the causes? The most common cause of this condition is a collapsed or blocked airway. An airway can collapse or become blocked if:  Your throat muscles are abnormally relaxed.  Your tongue and tonsils are larger than normal.  You are overweight.  Your airway is smaller than normal.  What increases the risk? This condition is more likely to develop in people who:  Are overweight.  Smoke.  Have a smaller than normal airway.  Are elderly.  Are male.  Drink alcohol.  Take sedatives or tranquilizers.  Have a family history of sleep apnea.  What are the signs or symptoms? Symptoms of this condition include:  Trouble staying asleep.  Daytime sleepiness and tiredness.  Irritability.  Loud snoring.  Morning  headaches.  Trouble concentrating.  Forgetfulness.  Decreased interest in sex.  Unexplained sleepiness.  Mood swings.  Personality changes.  Feelings of depression.  Waking up often during the night to urinate.  Dry mouth.  Sore throat.  How is this diagnosed? This condition may be diagnosed with:  A medical history.  A physical exam.  A series of tests that are done while you are sleeping (sleep study). These tests are usually done in a sleep lab, but they may also be done at home.  How is this treated? Treatment for this condition aims to restore normal breathing and to ease symptoms during sleep. It may involve managing health issues that can affect breathing, such as high blood pressure or obesity. Treatment may include:  Sleeping on your side.  Using a decongestant if you have nasal congestion.  Avoiding the use of depressants, including alcohol, sedatives, and narcotics.  Losing weight if you are overweight.  Making changes to your diet.  Quitting smoking.  Using a device to open your airway while you sleep, such as: ? An oral appliance. This is a custom-made mouthpiece that shifts your lower jaw forward. ? A continuous positive airway pressure (CPAP) device. This device delivers oxygen to your airway through a mask. ? A nasal expiratory positive airway pressure (EPAP) device. This device has valves that you put into each nostril. ? A bi-level positive airway pressure (BPAP) device. This device delivers oxygen to your airway through a mask.  Surgery if other treatments do not  work. During surgery, excess tissue is removed to create a wider airway.  It is important to get treatment for sleep apnea. Without treatment, this condition can lead to:  High blood pressure.  Coronary artery disease.  (Men) An inability to achieve or maintain an erection (impotence).  Reduced thinking abilities.  Follow these instructions at home:  Make any lifestyle  changes that your health care provider recommends.  Eat a healthy, well-balanced diet.  Take over-the-counter and prescription medicines only as told by your health care provider.  Avoid using depressants, including alcohol, sedatives, and narcotics.  Take steps to lose weight if you are overweight.  If you were given a device to open your airway while you sleep, use it only as told by your health care provider.  Do not use any tobacco products, such as cigarettes, chewing tobacco, and e-cigarettes. If you need help quitting, ask your health care provider.  Keep all follow-up visits as told by your health care provider. This is important. Contact a health care provider if:  The device that you received to open your airway during sleep is uncomfortable or does not seem to be working.  Your symptoms do not improve.  Your symptoms get worse. Get help right away if:  You develop chest pain.  You develop shortness of breath.  You develop discomfort in your back, arms, or stomach.  You have trouble speaking.  You have weakness on one side of your body.  You have drooping in your face. These symptoms may represent a serious problem that is an emergency. Do not wait to see if the symptoms will go away. Get medical help right away. Call your local emergency services (911 in the [place] Do not drive yourself to the hospital. This information is not intended to replace advice given to you by your health care provider. Make sure you discuss any questions you have with your health care provider. Document Released: 04/16/2002 Document Revised: 12/21/2015 Document Reviewed: 02/03/2015 Elsevier Interactive Patient Education  [employer].

## 2016-11-04 NOTE — Progress Notes (Signed)
   Subjective:    Patient ID: Malik Jackson, male    DOB: Oct 04, 1990, 26 y.o.   MRN: [id number]  HPI Chief Complaint  Patient presents with  . discuss sleep study    discuss sleep study. bp has been running 123/87- 146/95    He is here to discuss abnormal sleep study. He was diagnosed with severe OSA and CPAP has been recommended. He sleeps during the daytime since he works nights.  States he has black out curtains.   States he has been checking BP at home and his readings have been elevated. He is taking amlodipine 5 mg only and reports good compliance. No side effects.   States he knows he needs to lose weight and has tried making healthier food choices.  States he has not been exercising but plans to start.   States he does not take sleep medication but occasionally he drinks alcohol.   No other concerns today.   Denies fever, chills, chest pain, palpitations, shortness of breath, cough, abdominal pain, N/V/D.   Reviewed allergies, medications, past medical, surgical,  and social history.   Review of Systems Pertinent positives and negatives in the history of present illness.     Objective:   Physical Exam BP 130/82   Pulse 77   Wt 246 lb 3.2 oz (111.7 kg)   BMI 40.97 kg/m   Alert and oriented and in no acute distress. Not otherwise examined.     Assessment & Plan:  OSA (obstructive sleep apnea)  Hypertension, unspecified type  Morbid obesity (HCC)  Congratulated him on 4 lb weight loss and encouraged him to continue losing weight.  Counseled on good sleep hygiene and what to expect with his CPAP.  Advised him to avoid alcohol, sleep medications, and to avoid lying supine as recommended by Dr Maple HudsonYoung.  Discussed that he should continue keeping a log of his BP at home and if his readings do not improve with CPAP use then we will need to increase his medication. He will continue on current dose for now and watch sodium intake.  Plan to have him follow up 1 month  after starting on CPAP for sleep apnea.

## 2016-12-03 DIAGNOSIS — G4733 Obstructive sleep apnea (adult) (pediatric): Secondary | ICD-10-CM | POA: Diagnosis not present

## 2016-12-13 ENCOUNTER — Ambulatory Visit: Payer: BLUE CROSS/BLUE SHIELD | Admitting: Family Medicine

## 2017-01-03 DIAGNOSIS — G4733 Obstructive sleep apnea (adult) (pediatric): Secondary | ICD-10-CM | POA: Diagnosis not present

## 2017-02-02 ENCOUNTER — Encounter: Payer: Self-pay | Admitting: Family Medicine

## 2017-02-02 ENCOUNTER — Ambulatory Visit (INDEPENDENT_AMBULATORY_CARE_PROVIDER_SITE_OTHER): Payer: BLUE CROSS/BLUE SHIELD | Admitting: Family Medicine

## 2017-02-02 VITALS — BP 120/80 | HR 68 | Wt 248.2 lb

## 2017-02-02 DIAGNOSIS — I10 Essential (primary) hypertension: Secondary | ICD-10-CM

## 2017-02-02 DIAGNOSIS — S99929A Unspecified injury of unspecified foot, initial encounter: Secondary | ICD-10-CM | POA: Diagnosis not present

## 2017-02-02 DIAGNOSIS — Z9989 Dependence on other enabling machines and devices: Secondary | ICD-10-CM

## 2017-02-02 DIAGNOSIS — G4733 Obstructive sleep apnea (adult) (pediatric): Secondary | ICD-10-CM | POA: Insufficient documentation

## 2017-02-02 NOTE — Progress Notes (Signed)
   Subjective:    Patient ID: Malik Brahmbhattmale    DOB: 1990-11-13, 26 y.o.   MRN: [id number]  HPI Chief Complaint  Patient presents with  . follow-up    follow-up on cpap. feels more rested in the morning and doing well on it. flu shot will be given at work   He is here to follow up on OSA and CPAP use. States he is using his CPAP nightly and sleeps well. States his wife is happy that he is now sleeping. Stats he is only able to sleep 5 hours the nights that he works but reports using is at least 7 hours the other nights.  States his energy level has improved.   Complains of 2 week history of injury to his great toe nail on his right foot. States he hit his toe with the screen door. State it bled at the time. He cleaned it and applied a triple antibiotic ointment. Up to date with his Tdap.  No issues but would like to have it checked. Denies pain or tenderness.   States he is taking amlodipine daily without any issues.   Denies fever, chills, chest pain, palpitations, shortness of breath,  LE edema.   Reviewed allergies, medications, past medical, and social history.   Review of Systems Pertinent positives and negatives in the history of present illness.     Objective:   Physical Exam BP 120/80   Pulse 68   Wt 248 lb 3.2 oz (112.6 kg)   BMI 41.30 kg/m   Right great toe nail with a hairline split but the nail matrix is intact. The nail is flat and is still intact.  No erythema, edema, drainage. Non tender. Brisk cap refill, normal ROM and strength. Toe and foot is otherwise normal.       Assessment & Plan:  OSA on CPAP  Hypertension, unspecified type  Injury of nail bed of toe  He reports using his CPAP nightly and is feeling better. He is benefiting from CPAP use and I recommend he continue using it.  BP is not at goal. Continue with medication and CPAP for this.  Discussed that his toe nail is not infected and is not bothering him at this time. If he decides to see  a podiatrist then he will call and let us know.  Received compliance report from [facility] after patient had left office. He is using it 92% of the days but it shows that he needs to increase usage hours.

## 2017-02-03 DIAGNOSIS — G4733 Obstructive sleep apnea (adult) (pediatric): Secondary | ICD-10-CM | POA: Diagnosis not present

## 2017-02-17 DIAGNOSIS — Z23 Encounter for immunization: Secondary | ICD-10-CM | POA: Diagnosis not present

## 2017-02-25 ENCOUNTER — Other Ambulatory Visit: Payer: Self-pay | Admitting: Family Medicine

## 2017-02-25 DIAGNOSIS — I10 Essential (primary) hypertension: Secondary | ICD-10-CM

## 2017-06-23 ENCOUNTER — Other Ambulatory Visit: Payer: Self-pay | Admitting: Family Medicine

## 2017-06-23 DIAGNOSIS — I10 Essential (primary) hypertension: Secondary | ICD-10-CM

## 2017-07-26 ENCOUNTER — Ambulatory Visit (INDEPENDENT_AMBULATORY_CARE_PROVIDER_SITE_OTHER): Payer: BLUE CROSS/BLUE SHIELD | Admitting: Family Medicine

## 2017-07-26 ENCOUNTER — Encounter: Payer: Self-pay | Admitting: Family Medicine

## 2017-07-26 VITALS — BP 124/80 | HR 89 | Wt 260.6 lb

## 2017-07-26 DIAGNOSIS — G4733 Obstructive sleep apnea (adult) (pediatric): Secondary | ICD-10-CM

## 2017-07-26 DIAGNOSIS — Z9989 Dependence on other enabling machines and devices: Secondary | ICD-10-CM

## 2017-07-26 DIAGNOSIS — I10 Essential (primary) hypertension: Secondary | ICD-10-CM | POA: Diagnosis not present

## 2017-07-26 NOTE — Patient Instructions (Addendum)
Cut back on portion sizes, sugar drinks and carbohydrates (potatoes, rice, pasta, bread).   Try to increase your physical activity.   Continue amlodipine. Your BP today is 124/80.   Return after Sep 23 2017 for your annual exam. Come in fasting (nothing to eat or drink except water for at least 6 hours).

## 2017-07-26 NOTE — Progress Notes (Signed)
   Subjective:    Patient ID: Malik Jackson, male    DOB: 1991/04/27, 27 y.o.   MRN: [id number]  HPI Chief Complaint  Patient presents with  . 6 month follow-up    6 month follow-up on cpap. compliant with his CPAP machine   He is here for a 6 month follow up on OSA with CPAP obesity, and HTN.   States he is using his CPAP nightly.  Sleeping well with it. Feels more rested.  He is working night shift still. States he is aware that he has gained some weight.  Reports poor diet and is not exercising.  No new concerns or complaints today.   Denies fever, chills, dizziness, chest pain, palpitations, shortness of breath, abdominal pain, N/V/D, urinary symptoms.   Reviewed allergies, medications, past medical, surgical, family, and social history.     Review of Systems Pertinent positives and negatives in the history of present illness.     Objective:   Physical Exam BP 124/80   Pulse 89   Wt 260 lb 9.6 oz (118.2 kg)   BMI 43.37 kg/m  Alert and oriented and in no acute distress.  Not otherwise examined.      Assessment & Plan:  OSA on CPAP  Essential hypertension  Morbid obesity (HCC)  Compliance report shows that he is using his CPAP nightly and doing well on this.  Patient feels like he is benefiting from his CPAP.  I recommend continued nightly use. Blood pressure is well controlled.  He will continue on amlodipine.  We did discuss improving his diet. He is aware that he is in the severely obese category and we did discuss increased health risks related to obesity. Counseled on improving lifestyle.  Strongly encouraged him to use smaller dinner plates, cut back on portion sizes and allow himself 10 minutes after eating the first plate before he goes back for seconds.  Advised him to cut back on sugary drinks as well as carbohydrates.  He would like to try and increase his physical activity. He will return when he is due for his fasting annual physical.

## 2017-08-02 ENCOUNTER — Ambulatory Visit: Payer: BLUE CROSS/BLUE SHIELD | Admitting: Family Medicine

## 2017-12-12 ENCOUNTER — Encounter: Payer: Self-pay | Admitting: Family Medicine

## 2017-12-20 NOTE — Progress Notes (Signed)
Subjective:    Patient ID: Malik Jackson, male    DOB: [birth year], 27 y.o.   MRN: [id number]  HPI Chief Complaint  Patient presents with  . fasting    fasting cpe. no other concerns   He is here for a complete physical exam.  He is concerned about a spot on his back. Dark area of skin that is not bothersome. Not pruritic. His wife told him about this.  HTN- is not checking this at home. Taking amlodipine daily. No issues with this.  Diet is unhealthy.  He eats fast food often. Drinks sweet tea.  Is not exercising.  He is aware that he has gained weight since last year, approximately 10 lbs.  Hyperlipidemia last year and advised that he eat healthier.   Other providers: Dentist   Social history: Lives with wife, 2 kids ages 343 and 45. works in the [employer].  Denies smoking or drug use. He is drinking alcohol but reportedly 2-3 beers per week.   History of elevated ALT. Denies NSAID use.    Immunizations: UTD  Health maintenance:  Colonoscopy: N/A Last PSA: N/A Last Dental Exam: past few months  Last Eye Exam: years   Wears seatbelt always, uses sunscreen, smoke detectors in home and functioning, does not text while driving, feels safe in home environment.  Reviewed allergies, medications, past medical, surgical, family, and social history.   Review of Systems Review of Systems Constitutional: -fever, -chills, -sweats, -unexpected weight change,-fatigue ENT: -runny nose, -ear pain, -sore throat Cardiology:  -chest pain, -palpitations, -edema Respiratory: -cough, -shortness of breath, -wheezing Gastroenterology: -abdominal pain, -nausea, -vomiting, -diarrhea, -constipation  Hematology: -bleeding or bruising problems Musculoskeletal: -arthralgias, -myalgias, -joint swelling, -back pain Ophthalmology: -vision changes Urology: -dysuria, -difficulty urinating, -hematuria, -urinary frequency, -urgency Neurology: -headache, -weakness, -tingling, -numbness      Objective:   Physical Exam BP 124/84   Pulse 88   Ht 5' 5.5" (1.664 m)   Wt 258 lb 3.2 oz (117.1 kg)   BMI 42.31 kg/m   General Appearance:    Alert, cooperative, no distress, appears stated age  Head:    Normocephalic, without obvious abnormality, atraumatic  Eyes:    PERRL, conjunctiva/corneas clear, EOM's intact, fundi    benign  Ears:    Normal TM's and external ear canals  Nose:   Nares normal, mucosa normal, no drainage or sinus   tenderness  Throat:   Lips, mucosa, and tongue normal; teeth and gums normal  Neck:   Supple, no lymphadenopathy;  thyroid:  no   enlargement/tenderness/nodules; no carotid   bruit or JVD  Back:    Spine nontender, no curvature, ROM normal, no CVA     tenderness  Lungs:     Clear to auscultation bilaterally without wheezes, rales or     ronchi; respirations unlabored  Chest Wall:    No tenderness or deformity   Heart:    Regular rate and rhythm, S1 and S2 normal, no murmur, rub   or gallop  Breast Exam:    No chest wall tenderness, masses or gynecomastia  Abdomen:     Soft, non-tender, nondistended, normoactive bowel sounds,    no masses, no hepatosplenomegaly  Genitalia:    Normal uncircumcised male external genitalia without lesions. Testicles without masses.  No inguinal hernias.  Rectal:   Deferred due to age <40 and lack of symptoms  Extremities:   No clubbing, cyanosis or edema  Pulses:   2+ and symmetric all extremities  Skin:  Skin color, texture, turgor normal, hyperpigmentation of mid back over spine. Non pruritic.   Lymph nodes:   Cervical, supraclavicular, and axillary nodes normal  Neurologic:   CNII-XII intact, normal strength, sensation and gait; reflexes 2+ and symmetric throughout          Psych:   Normal mood, affect, hygiene and grooming.     Urinalysis dipstick: negative       Assessment & Plan:  Routine general medical examination at a health care facility - Plan: CBC with Differential/Platelet, Comprehensive metabolic  panel, POCT Urinalysis DIP (Proadvantage Device), TSH, T4, free, Lipid panel  Essential hypertension - Plan: CBC with Differential/Platelet, Comprehensive metabolic panel, amLODipine (NORVASC) 5 MG tablet  OSA on CPAP  Morbid obesity (HCC) - Plan: TSH, T4, free, Hemoglobin A1c, Lipid panel  Screen for STD (sexually transmitted disease) - Plan: GC/Chlamydia Probe Amp, RPR, HIV antibody  Hyperpigmentation of skin  Elevated ALT measurement - Plan: Comprehensive metabolic panel  Mixed hyperlipidemia - Plan: Lipid panel  Family history of diabetes mellitus in father - Plan: Hemoglobin A1c  He appears to be doing well overall.  His blood pressure is within goal range.  We will keep him on the amlodipine. Counseling done on healthy diet and exercise for weight loss as well as hypertension control.  He is concerned about diabetes.  His father has a history of this.  He would like to be tested for diabetes but he does not appear to be symptomatic. He has a history of hyperlipidemia.  Counseling on diet to control this as well was done. Discussed potential health consequences related to obesity. His ALT was elevated and he did not follow-up to have this rechecked.  We will recheck this today. OSA on CPAP.  He reports nightly use but he does skip a night occasionally.  We will get his compliance report on this.  He appears to be benefiting from use of CPAP.  Follow-up pending labs.

## 2017-12-21 ENCOUNTER — Ambulatory Visit: Payer: BLUE CROSS/BLUE SHIELD | Admitting: Family Medicine

## 2017-12-21 ENCOUNTER — Encounter: Payer: Self-pay | Admitting: Family Medicine

## 2017-12-21 VITALS — BP 124/84 | HR 88 | Ht 65.5 in | Wt 258.2 lb

## 2017-12-21 DIAGNOSIS — Z113 Encounter for screening for infections with a predominantly sexual mode of transmission: Secondary | ICD-10-CM

## 2017-12-21 DIAGNOSIS — E782 Mixed hyperlipidemia: Secondary | ICD-10-CM

## 2017-12-21 DIAGNOSIS — G4733 Obstructive sleep apnea (adult) (pediatric): Secondary | ICD-10-CM

## 2017-12-21 DIAGNOSIS — Z833 Family history of diabetes mellitus: Secondary | ICD-10-CM

## 2017-12-21 DIAGNOSIS — L819 Disorder of pigmentation, unspecified: Secondary | ICD-10-CM

## 2017-12-21 DIAGNOSIS — R740 Nonspecific elevation of levels of transaminase and lactic acid dehydrogenase [LDH]: Secondary | ICD-10-CM

## 2017-12-21 DIAGNOSIS — Z0000 Encounter for general adult medical examination without abnormal findings: Secondary | ICD-10-CM

## 2017-12-21 DIAGNOSIS — R7401 Elevation of levels of liver transaminase levels: Secondary | ICD-10-CM

## 2017-12-21 DIAGNOSIS — I10 Essential (primary) hypertension: Secondary | ICD-10-CM

## 2017-12-21 DIAGNOSIS — Z9989 Dependence on other enabling machines and devices: Secondary | ICD-10-CM

## 2017-12-21 LAB — POCT URINALYSIS DIP (PROADVANTAGE DEVICE)
Bilirubin, UA: NEGATIVE
Blood, UA: NEGATIVE
Glucose, UA: NEGATIVE mg/dL
Ketones, POC UA: NEGATIVE mg/dL
Leukocytes, UA: NEGATIVE
Nitrite, UA: NEGATIVE
Protein Ur, POC: NEGATIVE mg/dL
Specific Gravity, Urine: 1.025
Urobilinogen, Ur: NEGATIVE
pH, UA: 6 (ref 5.0–8.0)

## 2017-12-21 MED ORDER — AMLODIPINE BESYLATE 5 MG PO TABS: 5.0000 mg | ORAL_TABLET | Freq: Every day | ORAL | 3 refills | Status: DC

## 2017-12-21 NOTE — Patient Instructions (Signed)
Cut back on sugary drinks and sweets. Eat fewer carbohydrates specifically bread, potatoes, rice, pasta.  Make healthier food choices such as baked, gilled, broiled and avoid fried foods.  Try to increase your physical activity and get at least 150 minutes of physical activity per week.   We will call you with your lab results.   You can return for your flu shot after tomorrow. This will just need to be a nurse visit.    Preventative Care for Adults, Male       REGULAR HEALTH EXAMS:  A routine yearly physical is a good way to check in with your primary care provider about your health and preventive screening. It is also an opportunity to share updates about your health and any concerns you have, and receive a thorough all-over exam.   Most health insurance companies pay for at least some preventative services.  Check with your health plan for specific coverages.  WHAT PREVENTATIVE SERVICES DO MEN NEED?  Adult men should have their weight and blood pressure checked regularly.   Men age 10235 and older should have their cholesterol levels checked regularly.  Beginning at age 29 and continuing to age 29, men should be screened for colorectal cancer.  Certain people should may need continued testing until age 29.  Other cancer screening may include exams for testicular and prostate cancer.  Updating vaccinations is part of preventative care.  Vaccinations help protect against diseases such as the flu.  Lab tests are generally done as part of preventative care to screen for anemia and blood disorders, to screen for problems with the kidneys and liver, to screen for bladder problems, to check blood sugar, and to check your cholesterol level.  Preventative services generally include counseling about diet, exercise, avoiding tobacco, drugs, excessive alcohol consumption, and sexually transmitted infections.    GENERAL RECOMMENDATIONS FOR GOOD HEALTH:  Healthy diet:  Eat a variety of foods,  including fruit, vegetables, animal or vegetable protein, such as meat, fish, chicken, and eggs, or beans, lentils, tofu, and grains, such as rice.  Drink plenty of water daily.  Decrease saturated fat in the diet, avoid lots of red meat, processed foods, sweets, fast foods, and fried foods.  Exercise:  Aerobic exercise helps maintain good heart health. At least 30-40 minutes of moderate-intensity exercise is recommended. For example, a brisk walk that increases your heart rate and breathing. This should be done on most days of the week.   Find a type of exercise or a variety of exercises that you enjoy so that it becomes a part of your daily life.  Examples are running, walking, swimming, water aerobics, and biking.  For motivation and support, explore group exercise such as aerobic class, spin class, Zumba, Yoga,or  martial arts, etc.    Set exercise goals for yourself, such as a certain weight goal, walk or run in a race such as a 5k walk/run.  Speak to your primary care provider about exercise goals.  Disease prevention:  If you smoke or chew tobacco, find out from your caregiver how to quit. It can literally save your life, no matter how long you have been a tobacco user. If you do not use tobacco, never begin.   Maintain a healthy diet and normal weight. Increased weight leads to problems with blood pressure and diabetes.   The Body Mass Index or BMI is a way of measuring how much of your body is fat. Having a BMI above 27 increases the risk  of heart disease, diabetes, hypertension, stroke and other problems related to obesity. Your caregiver can help determine your BMI and based on it develop an exercise and dietary program to help you achieve or maintain this important measurement at a healthful level.  High blood pressure causes heart and blood vessel problems.  Persistent high blood pressure should be treated with medicine if weight loss and exercise do not work.   Fat and  cholesterol leaves deposits in your arteries that can block them. This causes heart disease and vessel disease elsewhere in your body.  If your cholesterol is found to be high, or if you have heart disease or certain other medical conditions, then you may need to have your cholesterol monitored frequently and be treated with medication.   Ask if you should have a stress test if your history suggests this. A stress test is a test done on a treadmill that looks for heart disease. This test can find disease prior to there being a problem.  Avoid drinking alcohol in excess (more than two drinks per day).  Avoid use of street drugs. Do not share needles with anyone. Ask for professional help if you need assistance or instructions on stopping the use of alcohol, cigarettes, and/or drugs.  Brush your teeth twice a day with fluoride toothpaste, and floss once a day. Good oral hygiene prevents tooth decay and gum disease. The problems can be painful, unattractive, and can cause other health problems. Visit your dentist for a routine oral and dental check up and preventive care every 6-12 months.   Look at your skin regularly.  Use a mirror to look at your back. Notify your caregivers of changes in moles, especially if there are changes in shapes, colors, a size larger than a pencil eraser, an irregular border, or development of new moles.  Safety:  Use seatbelts 100% of the time, whether driving or as a passenger.  Use safety devices such as hearing protection if you work in environments with loud noise or significant background noise.  Use safety glasses when doing any work that could send debris in to the eyes.  Use a helmet if you ride a bike or motorcycle.  Use appropriate safety gear for contact sports.  Talk to your caregiver about gun safety.  Use sunscreen with a SPF (or skin protection factor) of 15 or greater.  Lighter skinned people are at a greater risk of skin cancer. Don't forget to also wear  sunglasses in order to protect your eyes from too much damaging sunlight. Damaging sunlight can accelerate cataract formation.   Practice safe sex. Use condoms. Condoms are used for birth control and to help reduce the spread of sexually transmitted infections (or STIs).  Some of the STIs are gonorrhea (the clap), chlamydia, syphilis, trichomonas, herpes, HPV (human papilloma virus) and HIV (human immunodeficiency virus) which causes AIDS. The herpes, HIV and HPV are viral illnesses that have no cure. These can result in disability, cancer and death.   Keep carbon monoxide and smoke detectors in your home functioning at all times. Change the batteries every 6 months or use a model that plugs into the wall.   Vaccinations:  Stay up to date with your tetanus shots and other required immunizations. You should have a booster for tetanus every 10 years. Be sure to get your flu shot every year, since 5%-20% of the [place] population comes down with the flu. The flu vaccine changes each year, so being vaccinated once is not  enough. Get your shot in the fall, before the flu season peaks.   Other vaccines to consider:  Pneumococcal vaccine to protect against certain types of pneumonia.  This is normally recommended for adults age 62 or older.  However, adults younger than 29 years old with certain underlying conditions such as diabetes, heart or lung disease should also receive the vaccine.  Shingles vaccine to protect against Varicella Zoster if you are older than age 49, or younger than 29 years old with certain underlying illness.  Hepatitis A vaccine to protect against a form of infection of the liver by a virus acquired from food.  Hepatitis B vaccine to protect against a form of infection of the liver by a virus acquired from blood or body fluids, particularly if you work in health care.  If you plan to travel internationally, check with your local health department for specific vaccination  recommendations.  Cancer Screening:  Most routine colon cancer screening begins at the age of 60. On a yearly basis, doctors may provide special easy to use take-home tests to check for hidden blood in the stool. Sigmoidoscopy or colonoscopy can detect the earliest forms of colon cancer and is life saving. These tests use a small camera at the end of a tube to directly examine the colon. Speak to your caregiver about this at age 60, when routine screening begins (and is repeated every 5 years unless early forms of pre-cancerous polyps or small growths are found).   At the age of 38 men usually start screening for prostate cancer every year. Screening may begin at a younger age for those with higher risk. Those at higher risk include African-Americans or having a family history of prostate cancer. There are two types of tests for prostate cancer:   Prostate-specific antigen (PSA) testing. Recent studies raise questions about prostate cancer using PSA and you should discuss this with your caregiver.   Digital rectal exam (in which your doctor's lubricated and gloved finger feels for enlargement of the prostate through the anus).   Screening for testicular cancer.  Do a monthly exam of your testicles. Gently roll each testicle between your thumb and fingers, feeling for any abnormal lumps. The best time to do this is after a hot shower or bath when the tissues are looser. Notify your caregivers of any lumps, tenderness or changes in size or shape immediately.

## 2017-12-22 LAB — RPR: RPR Ser Ql: NONREACTIVE

## 2017-12-22 LAB — HIV ANTIBODY (ROUTINE TESTING W REFLEX): HIV Screen 4th Generation wRfx: NONREACTIVE

## 2017-12-22 LAB — COMPREHENSIVE METABOLIC PANEL
ALT: 97 IU/L — ABNORMAL HIGH (ref 0–44)
AST: 44 IU/L — ABNORMAL HIGH (ref 0–40)
Albumin/Globulin Ratio: 1.6 (ref 1.2–2.2)
Albumin: 4.7 g/dL (ref 3.5–5.5)
Alkaline Phosphatase: 100 IU/L (ref 39–117)
BUN/Creatinine Ratio: 13 (ref 9–20)
BUN: 13 mg/dL (ref 6–20)
Bilirubin Total: 0.5 mg/dL (ref 0.0–1.2)
CO2: 24 mmol/L (ref 20–29)
Calcium: 9.5 mg/dL (ref 8.7–10.2)
Chloride: 101 mmol/L (ref 96–106)
Creatinine, Ser: 0.99 mg/dL (ref 0.76–1.27)
GFR calc Af Amer: 118 mL/min/{1.73_m2} (ref >59)
GFR calc non Af Amer: 102 mL/min/{1.73_m2} (ref >59)
Globulin, Total: 2.9 g/dL (ref 1.5–4.5)
Glucose: 100 mg/dL — ABNORMAL HIGH (ref 65–99)
Potassium: 4.1 mmol/L (ref 3.5–5.2)
Sodium: 141 mmol/L (ref 134–144)
Total Protein: 7.6 g/dL (ref 6.0–8.5)

## 2017-12-22 LAB — CBC WITH DIFFERENTIAL/PLATELET
Basophils Absolute: 0 10*3/uL (ref 0.0–0.2)
Basos: 0 %
EOS (ABSOLUTE): 0.1 10*3/uL (ref 0.0–0.4)
Eos: 2 %
Hematocrit: 51.1 % — ABNORMAL HIGH (ref 37.5–51.0)
Hemoglobin: 18.2 g/dL — ABNORMAL HIGH (ref 13.0–17.7)
Immature Grans (Abs): 0 10*3/uL (ref 0.0–0.1)
Immature Granulocytes: 0 %
Lymphocytes Absolute: 2.9 10*3/uL (ref 0.7–3.1)
Lymphs: 39 %
MCH: 30.6 pg (ref 26.6–33.0)
MCHC: 35.6 g/dL (ref 31.5–35.7)
MCV: 86 fL (ref 79–97)
Monocytes Absolute: 0.5 10*3/uL (ref 0.1–0.9)
Monocytes: 7 %
Neutrophils Absolute: 4 10*3/uL (ref 1.4–7.0)
Neutrophils: 52 %
Platelets: 226 10*3/uL (ref 150–450)
RBC: 5.94 x10E6/uL — ABNORMAL HIGH (ref 4.14–5.80)
RDW: 14.1 % (ref 12.3–15.4)
WBC: 7.5 10*3/uL (ref 3.4–10.8)

## 2017-12-22 LAB — GC/CHLAMYDIA PROBE AMP
Chlamydia trachomatis, NAA: NEGATIVE
Neisseria gonorrhoeae by PCR: NEGATIVE

## 2017-12-22 LAB — LIPID PANEL
Chol/HDL Ratio: 5.2 ratio — ABNORMAL HIGH (ref 0.0–5.0)
Cholesterol, Total: 214 mg/dL — ABNORMAL HIGH (ref 100–199)
HDL: 41 mg/dL (ref >39)
LDL Calculated: 123 mg/dL — ABNORMAL HIGH (ref 0–99)
Triglycerides: 250 mg/dL — ABNORMAL HIGH (ref 0–149)
VLDL Cholesterol Cal: 50 mg/dL — ABNORMAL HIGH (ref 5–40)

## 2017-12-22 LAB — HEMOGLOBIN A1C
Est. average glucose Bld gHb Est-mCnc: 114 mg/dL
Hgb A1c MFr Bld: 5.6 % (ref 4.8–5.6)

## 2017-12-22 LAB — T4, FREE: Free T4: 0.96 ng/dL (ref 0.82–1.77)

## 2017-12-22 LAB — TSH: TSH: 1.99 u[IU]/mL (ref 0.450–4.500)

## 2018-02-16 DIAGNOSIS — Z23 Encounter for immunization: Secondary | ICD-10-CM | POA: Diagnosis not present

## 2018-03-13 ENCOUNTER — Encounter: Payer: Self-pay | Admitting: Family Medicine

## 2018-03-14 ENCOUNTER — Other Ambulatory Visit: Payer: Self-pay | Admitting: Family Medicine

## 2018-03-14 DIAGNOSIS — I10 Essential (primary) hypertension: Secondary | ICD-10-CM

## 2019-01-22 DIAGNOSIS — J019 Acute sinusitis, unspecified: Secondary | ICD-10-CM | POA: Diagnosis not present

## 2019-02-15 DIAGNOSIS — Z23 Encounter for immunization: Secondary | ICD-10-CM | POA: Diagnosis not present

## 2019-03-22 ENCOUNTER — Other Ambulatory Visit: Payer: Self-pay

## 2019-03-22 ENCOUNTER — Encounter: Payer: Self-pay | Admitting: Family Medicine

## 2019-03-22 ENCOUNTER — Ambulatory Visit: Payer: BC Managed Care – PPO | Admitting: Family Medicine

## 2019-03-22 VITALS — BP 120/70 | HR 85 | Temp 97.2°F | Ht 66.5 in | Wt 270.6 lb

## 2019-03-22 DIAGNOSIS — Z833 Family history of diabetes mellitus: Secondary | ICD-10-CM

## 2019-03-22 DIAGNOSIS — I10 Essential (primary) hypertension: Secondary | ICD-10-CM | POA: Diagnosis not present

## 2019-03-22 DIAGNOSIS — Z0000 Encounter for general adult medical examination without abnormal findings: Secondary | ICD-10-CM | POA: Diagnosis not present

## 2019-03-22 DIAGNOSIS — E782 Mixed hyperlipidemia: Secondary | ICD-10-CM

## 2019-03-22 DIAGNOSIS — K760 Fatty (change of) liver, not elsewhere classified: Secondary | ICD-10-CM

## 2019-03-22 DIAGNOSIS — G4733 Obstructive sleep apnea (adult) (pediatric): Secondary | ICD-10-CM

## 2019-03-22 NOTE — Progress Notes (Signed)
Subjective:    Patient ID: Malik Jackson, male    DOB: 02/01/1991, 28 y.o.   MRN: [id number]  HPI Chief Complaint  Patient presents with  . cpe    cpe and form completion,    He is here for a complete physical exam. Last CPE: 12/2017  HTN- has not been on medication since May 2020 per patient. He does check his BP at times. Readings seem to be fine.   OSA- is not using CPAP but he has it at home. States he felt bloated and gassy but did not talk to the sleep agency about adjusting settings or mask to see if it helped.   HL- poor diet and no exercise.   Hx of fatty liver- he has gained weight and increased alcohol intake.   Morbid obesity- Reports having increased anxiety and is stress eating. Eating more comfort foods. Drinking sodas. Eats fast food at least half the month.  Aware that he has gained 12 lbs since last year.   Is not fasting today, states he last ate at 8 am today. Eggs and sausage   Other providers: Dentist  Social history: Lives with wife, 2 kids ages 56 and 61. works in the [employer].  Denies smoking or drug use.  He is drinking alcohol and now drinking Guiness beer   No NSAIDs   Diet: poor  Exercise: nothing regular   Immunizations: October 2020 at his job   Health maintenance:  Colonoscopy: N/A Last PSA: N/A Last Dental Exam:  Last year  Last Eye Exam: years ago   Wears seatbelt always, smoke detectors in home and functioning, does not text while driving, feels safe in home environment.  Reviewed allergies, medications, past medical, surgical, family, and social history.    Review of Systems Review of Systems Constitutional: -fever, -chills, -sweats, -unexpected weight change,-fatigue ENT: -runny nose, -ear pain, -sore throat Cardiology:  -chest pain, -palpitations, -edema Respiratory: -cough, -shortness of breath, -wheezing Gastroenterology: -abdominal pain, -nausea, -vomiting, -diarrhea, -constipation  Hematology: -bleeding or  bruising problems Musculoskeletal: -arthralgias, -myalgias, -joint swelling, -back pain Ophthalmology: -vision changes Urology: -dysuria, -difficulty urinating, -hematuria, -urinary frequency, -urgency Neurology: -headache, -weakness, -tingling, -numbness       Objective:   Physical Exam BP 120/70   Pulse 85   Temp (!) 97.2 F (36.2 C)   Ht 5' 6.5" (1.689 m)   Wt 270 lb 9.6 oz (122.7 kg)   HC 47" (119.4 cm) Comment: waist circumfernce  BMI 43.02 kg/m   General Appearance:    Alert, cooperative, no distress, appears stated age  Head:    Normocephalic, without obvious abnormality, atraumatic  Eyes:    PERRL, conjunctiva/corneas clear, EOM's intact  Ears:    Normal TM's and external ear canals  Nose:   Mask in place   Throat:   Mask in place   Neck:   Supple, no lymphadenopathy;  thyroid:  no   enlargement/tenderness/nodules  Back:    Spine nontender, no curvature, ROM normal, no CVA     tenderness  Lungs:     Clear to auscultation bilaterally without wheezes, rales or     ronchi; respirations unlabored  Chest Wall:    No tenderness or deformity   Heart:    Regular rate and rhythm, S1 and S2 normal, no murmur, rub   or gallop  Breast Exam:    No chest wall tenderness, masses or gynecomastia  Abdomen:     Soft, non-tender, nondistended, normoactive bowel sounds,  no masses, no hepatosplenomegaly  Genitalia:    Normal male external genitalia without lesions.  Testicles without masses.  No inguinal hernias.  Rectal:   Deferred due to age <40 and lack of symptoms  Extremities:   No clubbing, cyanosis or edema  Pulses:   2+ and symmetric all extremities  Skin:   Skin color, texture, turgor normal, no rashes or lesions  Lymph nodes:   Cervical, supraclavicular, and axillary nodes normal  Neurologic:   CNII-XII intact, normal strength, sensation and gait; reflexes 2+ and symmetric throughout          Psych:   Normal mood, affect, hygiene and grooming.         Assessment & Plan:   Routine general medical examination at a health care facility - Plan: CBC with Differential, Comprehensive metabolic panel, Lipid Panel, TSH -Here today for a CPE.  He is nonfasting.  Discussed preventive healthcare.  Counseling on healthy lifestyle including diet and exercise.  Immunizations reviewed and he reports getting his flu shot at work.  He does have paperwork that he would like for me to fill out for his employer.  Morbid obesity (HCC) - Plan: Hemoglobin A1c, TSH -He is aware that he has had a 12 pound weight gain since his last visit.  Attributes this to stress eating.  His diet is poor.  Does not exercise.  Discussed long-term health consequences associated with obesity including diabetes.  Mixed hyperlipidemia - Plan: Lipid Panel -Elevated LDL in the past.  Follow-up pending results  Family history of diabetes mellitus in father - Plan: Hemoglobin A1c  Essential hypertension -Blood pressure in goal range.  He has not taken amlodipine since May 2020.  Discussed that he may hold off on starting medication as long as he is checking his blood pressure at home and is staying in goal range.  He will need a refill of amlodipine if his blood pressure is elevated.  He will let me know  OSA (obstructive sleep apnea) -Counseling on health consequences associated with untreated sleep apnea.  Encouraged him to contact the company who he got his CPAP machine from and see if they can adjust his settings.  Fatty liver-discussed avoiding alcohol and weight loss to prevent this from worsening

## 2019-03-22 NOTE — Patient Instructions (Addendum)
It was a pleasure seeing you today.  Cut back on fried foods, fatty foods and cut back on the amount of soda you are drinking daily.  Try using a free app such as my fitness pal to track your calories.  I recommend calling and talking to the company which you received your CPAP from and discuss the issues you are having.  Keep an eye on your blood pressure and if you are not seeing readings less than 130/80, I recommend that you start back on your amlodipine  We will forward your lab results    Preventive Care 55-43 Years Old, Male Preventive care refers to lifestyle choices and visits with your health care provider that can promote health and wellness. This includes:  A yearly physical exam. This is also called an annual well check.  Regular dental and eye exams.  Immunizations.  Screening for certain conditions.  Healthy lifestyle choices, such as eating a healthy diet, getting regular exercise, not using drugs or products that contain nicotine and tobacco, and limiting alcohol use. What can I expect for my preventive care visit? Physical exam Your health care provider will check:  Height and weight. These may be used to calculate body mass index (BMI), which is a measurement that tells if you are at a healthy weight.  Heart rate and blood pressure.  Your skin for abnormal spots. Counseling Your health care provider may ask you questions about:  Alcohol, tobacco, and drug use.  Emotional well-being.  Home and relationship well-being.  Sexual activity.  Eating habits.  Work and work Statistician. What immunizations do I need?  Influenza (flu) vaccine  This is recommended every year. Tetanus, diphtheria, and pertussis (Tdap) vaccine  You may need a Td booster every 10 years. Varicella (chickenpox) vaccine  You may need this vaccine if you have not already been vaccinated. Human papillomavirus (HPV) vaccine  If recommended by your health care provider, you  may need three doses over 6 months. Measles, mumps, and rubella (MMR) vaccine  You may need at least one dose of MMR. You may also need a second dose. Meningococcal conjugate (MenACWY) vaccine  One dose is recommended if you are 87-66 years old and a Market researcher living in a residence hall, or if you have one of several medical conditions. You may also need additional booster doses. Pneumococcal conjugate (PCV13) vaccine  You may need this if you have certain conditions and were not previously vaccinated. Pneumococcal polysaccharide (PPSV23) vaccine  You may need one or two doses if you smoke cigarettes or if you have certain conditions. Hepatitis A vaccine  You may need this if you have certain conditions or if you travel or work in places where you may be exposed to hepatitis A. Hepatitis B vaccine  You may need this if you have certain conditions or if you travel or work in places where you may be exposed to hepatitis B. Haemophilus influenzae type b (Hib) vaccine  You may need this if you have certain risk factors. You may receive vaccines as individual doses or as more than one vaccine together in one shot (combination vaccines). Talk with your health care provider about the risks and benefits of combination vaccines. What tests do I need? Blood tests  Lipid and cholesterol levels. These may be checked every 5 years starting at age 94.  Hepatitis C test.  Hepatitis B test. Screening   Diabetes screening. This is done by checking your blood sugar (glucose) after you  have not eaten for a while (fasting).  Sexually transmitted disease (STD) testing. Talk with your health care provider about your test results, treatment options, and if necessary, the need for more tests. Follow these instructions at home: Eating and drinking   Eat a diet that includes fresh fruits and vegetables, whole grains, lean protein, and low-fat dairy products.  Take vitamin and  mineral supplements as recommended by your health care provider.  Do not drink alcohol if your health care provider tells you not to drink.  If you drink alcohol: ? Limit how much you have to 0-2 drinks a day. ? Be aware of how much alcohol is in your drink. In the [place] one drink equals one 12 oz bottle of beer (355 mL), one 5 oz glass of wine (148 mL), or one 1 oz glass of hard liquor (44 mL). Lifestyle  Take daily care of your teeth and gums.  Stay active. Exercise for at least 30 minutes on 5 or more days each week.  Do not use any products that contain nicotine or tobacco, such as cigarettes, e-cigarettes, and chewing tobacco. If you need help quitting, ask your health care provider.  If you are sexually active, practice safe sex. Use a condom or other form of protection to prevent STIs (sexually transmitted infections). What's next?  Go to your health care provider once a year for a well check visit.  Ask your health care provider how often you should have your eyes and teeth checked.  Stay up to date on all vaccines. This information is not intended to replace advice given to you by your health care provider. Make sure you discuss any questions you have with your health care provider. Document Released: 06/22/2001 Document Revised: 04/20/2018 Document Reviewed: 04/20/2018 Elsevier Patient Education  2020 [employer].

## 2019-03-23 LAB — COMPREHENSIVE METABOLIC PANEL
ALT: 80 IU/L — ABNORMAL HIGH (ref 0–44)
AST: 38 IU/L (ref 0–40)
Albumin/Globulin Ratio: 1.9 (ref 1.2–2.2)
Albumin: 4.7 g/dL (ref 4.1–5.2)
Alkaline Phosphatase: 118 IU/L — ABNORMAL HIGH (ref 39–117)
BUN/Creatinine Ratio: 16 (ref 9–20)
BUN: 15 mg/dL (ref 6–20)
Bilirubin Total: 0.4 mg/dL (ref 0.0–1.2)
CO2: 24 mmol/L (ref 20–29)
Calcium: 10 mg/dL (ref 8.7–10.2)
Chloride: 101 mmol/L (ref 96–106)
Creatinine, Ser: 0.96 mg/dL (ref 0.76–1.27)
GFR calc Af Amer: 122 mL/min/{1.73_m2} (ref >59)
GFR calc non Af Amer: 106 mL/min/{1.73_m2} (ref >59)
Globulin, Total: 2.5 g/dL (ref 1.5–4.5)
Glucose: 109 mg/dL — ABNORMAL HIGH (ref 65–99)
Potassium: 3.7 mmol/L (ref 3.5–5.2)
Sodium: 139 mmol/L (ref 134–144)
Total Protein: 7.2 g/dL (ref 6.0–8.5)

## 2019-03-23 LAB — CBC WITH DIFFERENTIAL/PLATELET
Basophils Absolute: 0.1 10*3/uL (ref 0.0–0.2)
Basos: 1 %
EOS (ABSOLUTE): 0.1 10*3/uL (ref 0.0–0.4)
Eos: 2 %
Hematocrit: 50.2 % (ref 37.5–51.0)
Hemoglobin: 17.6 g/dL (ref 13.0–17.7)
Immature Grans (Abs): 0.1 10*3/uL (ref 0.0–0.1)
Immature Granulocytes: 1 %
Lymphocytes Absolute: 2.6 10*3/uL (ref 0.7–3.1)
Lymphs: 35 %
MCH: 30.4 pg (ref 26.6–33.0)
MCHC: 35.1 g/dL (ref 31.5–35.7)
MCV: 87 fL (ref 79–97)
Monocytes Absolute: 0.5 10*3/uL (ref 0.1–0.9)
Monocytes: 7 %
Neutrophils Absolute: 4.1 10*3/uL (ref 1.4–7.0)
Neutrophils: 54 %
Platelets: 234 10*3/uL (ref 150–450)
RBC: 5.79 x10E6/uL (ref 4.14–5.80)
RDW: 13.3 % (ref 11.6–15.4)
WBC: 7.4 10*3/uL (ref 3.4–10.8)

## 2019-03-23 LAB — LIPID PANEL
Chol/HDL Ratio: 7.4 ratio — ABNORMAL HIGH (ref 0.0–5.0)
Cholesterol, Total: 236 mg/dL — ABNORMAL HIGH (ref 100–199)
HDL: 32 mg/dL — ABNORMAL LOW (ref >39)
LDL Chol Calc (NIH): 94 mg/dL (ref 0–99)
Triglycerides: 654 mg/dL (ref 0–149)
VLDL Cholesterol Cal: 110 mg/dL — ABNORMAL HIGH (ref 5–40)

## 2019-03-23 LAB — HEMOGLOBIN A1C
Est. average glucose Bld gHb Est-mCnc: 120 mg/dL
Hgb A1c MFr Bld: 5.8 % — ABNORMAL HIGH (ref 4.8–5.6)

## 2019-03-23 LAB — TSH: TSH: 2.38 u[IU]/mL (ref 0.450–4.500)

## 2019-03-26 DIAGNOSIS — R7303 Prediabetes: Secondary | ICD-10-CM | POA: Insufficient documentation

## 2019-03-26 DIAGNOSIS — E781 Pure hyperglyceridemia: Secondary | ICD-10-CM | POA: Insufficient documentation

## 2019-03-26 NOTE — Patient Instructions (Addendum)
Recommend that you start taking an over-the-counter omega-3 fatty acid  Limit sweets, sugars, carbohydrates and fatty foods.  Try to get 20 to 30 minutes a day of physical activity  We will be in touch with your results.    High Triglycerides Eating Plan Triglycerides are a type of fat in the blood. High levels of triglycerides can increase your risk of heart disease and stroke. If your triglyceride levels are high, choosing the right foods can help lower your triglycerides and keep your heart healthy. Work with your health care provider or a diet and nutrition specialist (dietitian) to develop an eating plan that is right for you. What are tips for following this plan? General guidelines   Lose weight, if you are overweight. For most people, losing 5-10 lbs (2-5 kg) helps lower triglyceride levels. A weight-loss plan may include. ? 30 minutes of exercise at least 5 days a week. ? Reducing the amount of calories, sugar, and fat you eat.  Eat a wide variety of fresh fruits, vegetables, and whole grains. These foods are high in fiber.  Eat foods that contain healthy fats, such as fatty fish, nuts, seeds, and olive oil.  Avoid foods that are high in added sugar, added salt (sodium), saturated fat, and trans fat.  Avoid low-fiber, refined carbohydrates such as white bread, crackers, noodles, and white rice.  Avoid foods with partially hydrogenated oils (trans fats), such as fried foods or stick margarine.  Limit alcohol intake to no more than 1 drink a day for nonpregnant women and 2 drinks a day for men. One drink equals 12 oz of beer, 5 oz of wine, or 1 oz of hard liquor. Your health care provider may recommend that you drink less depending on your overall health. Reading food labels  Check food labels for the amount of saturated fat. Choose foods with no or very little saturated fat.  Check food labels for the amount of trans fat. Choose foods with no trans fat.  Check food  labels for the amount of cholesterol. Choose foods low in cholesterol. Ask your dietitian how much cholesterol you should have each day.  Check food labels for the amount of sodium. Choose foods with less than 140 milligrams (mg) per serving. Shopping  Buy dairy products labeled as nonfat (skim) or low-fat (1%).  Avoid buying processed or prepackaged foods. These are often high in added sugar, sodium, and fat. Cooking  Choose healthy fats when cooking, such as olive oil or canola oil.  Cook foods using lower fat methods, such as baking, broiling, boiling, or grilling.  Make your own sauces, dressings, and marinades when possible, instead of buying them. Store-bought sauces, dressings, and marinades are often high in sodium and sugar. Meal planning  Eat more home-cooked food and less restaurant, buffet, and fast food.  Eat fatty fish at least 2 times each week. Examples of fatty fish include salmon, trout, mackerel, tuna, and herring.  If you eat whole eggs, do not eat more than 3 egg yolks per week. What foods are recommended? The items listed may not be a complete list. Talk with your dietitian about what dietary choices are best for you. Grains Whole wheat or whole grain breads, crackers, cereals, and pasta. Unsweetened oatmeal. Bulgur. Barley. Quinoa. Brown rice. Whole wheat flour tortillas. Vegetables Fresh or frozen vegetables. Low-sodium canned vegetables. Fruits All fresh, canned (in natural juice), or frozen fruits. Meats and other protein foods Skinless chicken or [place]. Ground chicken or [place]. Lean cuts  of pork, trimmed of fat. Fish and seafood, especially salmon, trout, and herring. Egg whites. Dried beans, peas, or lentils. Unsalted nuts or seeds. Unsalted canned beans. Natural peanut or almond butter. Dairy Low-fat dairy products. Skim or low-fat (1%) milk. Reduced fat (2%) and low-sodium cheese. Low-fat ricotta cheese. Low-fat cottage cheese. Plain, low-fat yogurt.  Fats and oils Tub margarine without trans fats. Light or reduced-fat mayonnaise. Light or reduced-fat salad dressings. Avocado. Safflower, olive, sunflower, soybean, and canola oils. What foods are not recommended? The items listed may not be a complete list. Talk with your dietitian about what dietary choices are best for you. Grains White bread. White (regular) pasta. White rice. Cornbread. Bagels. Pastries. Crackers that contain trans fat. Vegetables Creamed or fried vegetables. Vegetables in a cheese sauce. Fruits Sweetened dried fruit. Canned fruit in syrup. Fruit juice. Meats and other protein foods Fatty cuts of meat. Ribs. Chicken wings. Tomasa Blase. Sausage. Bologna. Salami. Chitterlings. Fatback. Hot dogs. Bratwurst. Packaged lunch meats. Dairy Whole or reduced-fat (2%) milk. Half-and-half. Cream cheese. Full-fat or sweetened yogurt. Full-fat cheese. Nondairy creamers. Whipped toppings. Processed cheese or cheese spreads. Cheese curds. Beverages Alcohol. Sweetened drinks, such as soda, lemonade, fruit drinks, or punches. Fats and oils Butter. Stick margarine. Lard. Shortening. Ghee. Bacon fat. Tropical oils, such as coconut, palm kernel, or palm oils. Sweets and desserts Corn syrup. Sugars. Honey. Molasses. Candy. Jam and jelly. Syrup. Sweetened cereals. Cookies. Pies. Cakes. Donuts. Muffins. Ice cream. Condiments Store-bought sauces, dressings, and marinades that are high in sugar, such as ketchup and barbecue sauce. Summary  High levels of triglycerides can increase the risk of heart disease and stroke. Choosing the right foods can help lower your triglycerides.  Eat plenty of fresh fruits, vegetables, and whole grains. Choose low-fat dairy and lean meats. Eat fatty fish at least twice a week.  Avoid processed and prepackaged foods with added sugar, sodium, saturated fat, and trans fat.  If you need suggestions or have questions about what types of food are good for you, talk  with your health care provider or a dietitian. This information is not intended to replace advice given to you by your health care provider. Make sure you discuss any questions you have with your health care provider. Document Released: 02/12/2004 Document Revised: 04/08/2017 Document Reviewed: 06/29/2016 Elsevier Patient Education  2020 [employer].

## 2019-03-26 NOTE — Progress Notes (Signed)
   Subjective:    Patient ID: Malik Jackson, male    DOB: 17-Sep-1990, 28 y.o.   MRN: [id number]  HPI Chief Complaint  Patient presents with  . discuss labs    discuss labs. fasting.    Here for a fasting follow up on moderately elevated triglycerides and low HDL as well as a prediabetes diagnosis which is new.  He admits to a poor diet high in fat and sugars.  He is not physically active.  Feels at baseline today no complaints.  Denies fever, chills, chest pain, palpitations, shortness of breath, abdominal pain, nausea, vomiting, diarrhea.   Review of Systems Pertinent positives and negatives in the history of present illness.     Objective:   Physical Exam BP 110/60   Pulse 86   Temp 97.7 F (36.5 C)   Wt 265 lb 6.4 oz (120.4 kg)   BMI 42.19 kg/m   Alert and oriented in no acute distress.  Not otherwise examined.      Assessment & Plan:  Abnormally low high density lipoprotein (HDL) cholesterol with hypertriglyceridemia - Plan: Comprehensive metabolic panel, Lipid Panel  Prediabetes - Plan: Comprehensive metabolic panel  Morbid obesity (HCC)  In-depth counseling on elevated triglycerides and low HDL and the potential long-term health consequences including heart disease and stroke.  He is fasting today and I will recheck his cholesterol panel. Counseling on prediabetes and the diabetes spectrum. I encouraged him to set a 10 pound weight loss goal and attempt to achieve this over the next 6 to 8 weeks.  Encouraged him to make healthy changes to his diet that he can maintain for a lifetime and increase his physical activity. He will start on an over-the-counter omega-3 fatty acid.  Discussed the possibility of adding a statin if needed pending lipid panel results.

## 2019-03-27 ENCOUNTER — Other Ambulatory Visit: Payer: Self-pay

## 2019-03-27 ENCOUNTER — Ambulatory Visit: Payer: BC Managed Care – PPO | Admitting: Family Medicine

## 2019-03-27 ENCOUNTER — Encounter: Payer: Self-pay | Admitting: Family Medicine

## 2019-03-27 VITALS — BP 110/60 | HR 86 | Temp 97.7°F | Wt 265.4 lb

## 2019-03-27 DIAGNOSIS — E781 Pure hyperglyceridemia: Secondary | ICD-10-CM | POA: Diagnosis not present

## 2019-03-27 DIAGNOSIS — E786 Lipoprotein deficiency: Secondary | ICD-10-CM | POA: Diagnosis not present

## 2019-03-27 DIAGNOSIS — R7303 Prediabetes: Secondary | ICD-10-CM | POA: Diagnosis not present

## 2019-03-28 ENCOUNTER — Other Ambulatory Visit: Payer: Self-pay | Admitting: Internal Medicine

## 2019-03-28 ENCOUNTER — Encounter: Payer: Self-pay | Admitting: Family Medicine

## 2019-03-28 ENCOUNTER — Encounter: Payer: Self-pay | Admitting: Gastroenterology

## 2019-03-28 DIAGNOSIS — R945 Abnormal results of liver function studies: Secondary | ICD-10-CM

## 2019-03-28 DIAGNOSIS — R7989 Other specified abnormal findings of blood chemistry: Secondary | ICD-10-CM

## 2019-03-28 DIAGNOSIS — K760 Fatty (change of) liver, not elsewhere classified: Secondary | ICD-10-CM

## 2019-03-28 HISTORY — DX: Abnormal results of liver function studies: R94.5

## 2019-03-28 HISTORY — DX: Other specified abnormal findings of blood chemistry: R79.89

## 2019-03-28 LAB — COMPREHENSIVE METABOLIC PANEL WITH GFR
ALT: 113 IU/L — ABNORMAL HIGH (ref 0–44)
AST: 49 IU/L — ABNORMAL HIGH (ref 0–40)
Albumin/Globulin Ratio: 1.8 (ref 1.2–2.2)
Albumin: 4.9 g/dL (ref 4.1–5.2)
Alkaline Phosphatase: 122 IU/L — ABNORMAL HIGH (ref 39–117)
BUN/Creatinine Ratio: 11 (ref 9–20)
BUN: 12 mg/dL (ref 6–20)
Bilirubin Total: 0.7 mg/dL (ref 0.0–1.2)
CO2: 25 mmol/L (ref 20–29)
Calcium: 9.4 mg/dL (ref 8.7–10.2)
Chloride: 100 mmol/L (ref 96–106)
Creatinine, Ser: 1.05 mg/dL (ref 0.76–1.27)
GFR calc Af Amer: 110 mL/min/1.73 (ref >59)
GFR calc non Af Amer: 95 mL/min/1.73 (ref >59)
Globulin, Total: 2.8 g/dL (ref 1.5–4.5)
Glucose: 101 mg/dL — ABNORMAL HIGH (ref 65–99)
Potassium: 3.8 mmol/L (ref 3.5–5.2)
Sodium: 141 mmol/L (ref 134–144)
Total Protein: 7.7 g/dL (ref 6.0–8.5)

## 2019-03-28 LAB — LIPID PANEL
Chol/HDL Ratio: 5.3 ratio — ABNORMAL HIGH (ref 0.0–5.0)
Cholesterol, Total: 227 mg/dL — ABNORMAL HIGH (ref 100–199)
HDL: 43 mg/dL (ref >39)
LDL Chol Calc (NIH): 150 mg/dL — ABNORMAL HIGH (ref 0–99)
Triglycerides: 186 mg/dL — ABNORMAL HIGH (ref 0–149)
VLDL Cholesterol Cal: 34 mg/dL (ref 5–40)

## 2019-05-08 ENCOUNTER — Other Ambulatory Visit (INDEPENDENT_AMBULATORY_CARE_PROVIDER_SITE_OTHER): Payer: BC Managed Care – PPO

## 2019-05-08 ENCOUNTER — Encounter: Payer: Self-pay | Admitting: Gastroenterology

## 2019-05-08 ENCOUNTER — Ambulatory Visit: Payer: BC Managed Care – PPO | Admitting: Gastroenterology

## 2019-05-08 VITALS — BP 186/110 | HR 98 | Temp 97.9°F | Ht 66.5 in | Wt 268.0 lb

## 2019-05-08 DIAGNOSIS — R748 Abnormal levels of other serum enzymes: Secondary | ICD-10-CM

## 2019-05-08 DIAGNOSIS — K760 Fatty (change of) liver, not elsewhere classified: Secondary | ICD-10-CM

## 2019-05-08 DIAGNOSIS — K219 Gastro-esophageal reflux disease without esophagitis: Secondary | ICD-10-CM

## 2019-05-08 LAB — HEPATIC FUNCTION PANEL
ALT: 61 U/L — ABNORMAL HIGH (ref 0–53)
AST: 29 U/L (ref 0–37)
Albumin: 4.6 g/dL (ref 3.5–5.2)
Alkaline Phosphatase: 130 U/L — ABNORMAL HIGH (ref 39–117)
Bilirubin, Direct: 0.1 mg/dL (ref 0.0–0.3)
Total Bilirubin: 0.4 mg/dL (ref 0.2–1.2)
Total Protein: 7.5 g/dL (ref 6.0–8.3)

## 2019-05-08 LAB — IBC + FERRITIN
Ferritin: 257.4 ng/mL (ref 22.0–322.0)
Iron: 96 ug/dL (ref 42–165)
Saturation Ratios: 25.6 % (ref 20.0–50.0)
Transferrin: 268 mg/dL (ref 212.0–360.0)

## 2019-05-08 NOTE — Progress Notes (Signed)
HPI :  28 year old male with a history of morbid obesity, OSA on CPAP, hypertension, referred by Harland Dingwall NP for elevated liver enzymes.  On review of chart the patient has had ALT elevation since 2018, ranging from 60s to 80s.  More recently had his labs repeated earlier in November his ALT has risen to 113, AST 49, alk phos 122, bilirubin normal.  Hemoglobin A1c 5.8, negative for viral hepatitis in 2018, TSH normal.  He has had a remote ultrasound in 2018 showing fatty liver.  He denies any family history of liver disease.  He does have a strong family history of diabetes and coronary artery disease.  He denies any personal history of liver disease that he is aware of.  He denies any abdominal pains.  No history of jaundice.  He does drink alcohol about once per week, he will drink about 8-10 beers in a sitting when he does drink.  Most days of the week he does not drink any alcohol at all.  He is hypertensive today however has been watching this with his primary care and states his blood pressure has actually been pretty well controlled at home and has not been any medicine for this recently.  He states he is not compliant with his CPAP device.  He tends to work nights, drinks regular soda at times.  His current weight is 268 pounds, he thinks he is gained about 15 pounds in the past year or so.  On review of labs his platelet count is normal  ALT 113, AST 49, AP 122,  T bil 0.2 TSH 2.38 A1c 5.8 Negative viral hep  ALT elevation since 2018 to 60-80s  [place] 10/11/2016 - fatty liver  Otherwise he does have some heartburn that is been bothering him lately, often when he lies down, he can feel acidic taste and pyrosis when he wakes up sometimes.  No dysphagia.  He has been using Pepcid as needed which has helped reduce his symptoms.  No dysphagia  Past Medical History:  Diagnosis Date  . Abnormal liver function tests 03/28/2019  . HTN (hypertension)    diagnosed 2 years ago  . Morbid  obesity ([place])   . OSA on CPAP      Past Surgical History:  Procedure Laterality Date  . APPENDECTOMY     Family History  Problem Relation Age of Onset  . Diabetes Father   . Hyperlipidemia Mother   . Hypertension Maternal Aunt   . Colon cancer Neg Hx   . Stomach cancer Neg Hx   . Pancreatic cancer Neg Hx   . Esophageal cancer Neg Hx    Social History   Tobacco Use  . Smoking status: Never Smoker  . Smokeless tobacco: Never Used  Substance Use Topics  . Alcohol use: Yes    Comment: 8 beers per week   . Drug use: No   No current outpatient medications on file.   No current facility-administered medications for this visit.   No Known Allergies   Review of Systems: All systems reviewed and negative except where noted in HPI.   Lab Results  Component Value Date   WBC 7.4 03/22/2019   HGB 17.6 03/22/2019   HCT 50.2 03/22/2019   MCV 87 03/22/2019   PLT 234 03/22/2019    Lab Results  Component Value Date   CREATININE 1.05 03/27/2019   BUN 12 03/27/2019   NA 141 03/27/2019   K 3.8 03/27/2019   CL 100 03/27/2019  CO2 25 03/27/2019    Lab Results  Component Value Date   ALT 61 (H) 05/08/2019   AST 29 05/08/2019   ALKPHOS 130 (H) 05/08/2019   BILITOT 0.4 05/08/2019     Physical Exam: BP (!) 186/110 (BP Location: Left Arm, Patient Position: Sitting, Cuff Size: Large)   Pulse 98   Temp 97.9 F (36.6 C)   Ht 5' 6.5" (1.689 m)   Wt 268 lb (121.6 kg)   SpO2 97%   BMI 42.61 kg/m  Constitutional: Pleasant,well-developed, male in no acute distress. HEENT: Normocephalic and atraumatic. Conjunctivae are normal. No scleral icterus. Neck supple.  Cardiovascular: Normal rate, regular rhythm.  Pulmonary/chest: Effort normal and breath sounds normal. No wheezing, rales or rhonchi. Abdominal: Soft, protuberant, nontender. there are no masses palpable. No hepatomegaly. Extremities: no edema Lymphadenopathy: No cervical adenopathy noted. Neurological: Alert and  oriented to person place and time. Skin: Skin is warm and dry. No rashes noted. Psychiatric: Normal mood and affect. Behavior is normal.   ASSESSMENT AND PLAN: 28 year old male here for new patient assessment of the following:  Elevated liver enzymes / fatty liver / obesity - mild elevation in ALT ongoing at least since 2018 with recent rise in enzymes.  I suspect this is very likely due to fatty liver noted on prior ultrasound and metabolic syndrome which we discussed, although I do recommend a full serologic work-up to make sure there is no evidence of other chronic liver diseases which could present like this.  We will send him to the lab to get this done today.  Otherwise we discussed fatty liver disease, risks for Karlene Lineman and cirrhosis over time.  Ultimately it would be most beneficial for him to lose weight not only for this issue but his health in general.  I offered him a referral to a nutritionist for this initially and he declined for now.  Recommend he start with reducing soda ingestion etc. which has a lot of empty calories.  I otherwise would recommend he reduce his alcohol intake and minimize its use.  His liver enzymes are not consistent with alcohol related liver disease however this is certainly not helping things.  Will await his labs and give him further recommendations based on that result.  We will monitor him periodically for this in our office.  GERD - mild reflux symptoms which is improved with Pepcid as needed.  He can use Pepcid over-the-counter which is safe if and is effective for him.  If this fails to resolve his symptoms or he has breakthrough despite this over time he can try using omeprazole as needed.  Harrison Cellar, MD [facility]  CC: Girtha Rm, NP-C

## 2019-05-08 NOTE — Patient Instructions (Addendum)
If you are age 30 or older, your body mass index should be between 23-30. Your Body mass index is 42.61 kg/m. If this is out of the aforementioned range listed, please consider follow up with your Primary Care Provider.  If you are age 47 or younger, your body mass index should be between 19-25. Your Body mass index is 42.61 kg/m. If this is out of the aformentioned range listed, please consider follow up with your Primary Care Provider.   Your provider has requested that you go to the basement level for lab work before leaving today. Press "B" on the elevator. The lab is located at the first door on the left as you exit the elevator.   Reduce alcholol intake.   Work on weight loss.

## 2019-05-09 LAB — HEPATITIS C ANTIBODY
Hepatitis C Ab: NONREACTIVE
SIGNAL TO CUT-OFF: 0.03 (ref <1.00)

## 2019-05-09 LAB — ALPHA-1-ANTITRYPSIN: A-1 Antitrypsin, Ser: 144 mg/dL (ref 83–199)

## 2019-05-09 LAB — HEPATITIS A ANTIBODY, TOTAL: Hepatitis A AB,Total: REACTIVE — AB

## 2019-05-09 LAB — CERULOPLASMIN: Ceruloplasmin: 26 mg/dL (ref 18–36)

## 2019-05-09 LAB — IGG: IgG (Immunoglobin G), Serum: 1244 mg/dL (ref 600–1640)

## 2019-05-09 LAB — ANA: Anti Nuclear Antibody (ANA): NEGATIVE

## 2019-05-09 LAB — HEPATITIS B SURFACE ANTIGEN: Hepatitis B Surface Ag: NONREACTIVE

## 2019-05-09 LAB — HEPATITIS B SURFACE ANTIBODY,QUALITATIVE: Hep B S Ab: REACTIVE — AB

## 2019-05-13 LAB — ANTI-SMOOTH MUSCLE AB BY IFA: Anti-Smooth Muscle Ab by IFA: <1:20 {titer}

## 2019-05-18 ENCOUNTER — Other Ambulatory Visit: Payer: Self-pay

## 2019-05-18 DIAGNOSIS — R748 Abnormal levels of other serum enzymes: Secondary | ICD-10-CM

## 2019-05-27 IMAGING — US US ABDOMEN LIMITED
1 series · 14 of 25 positions shown · non-contrast
Comparison: None.

CLINICAL DATA: 26 y/o  male with abnormal LFTs.

EXAM:
US ABDOMEN LIMITED - RIGHT UPPER QUADRANT

[Series 1: us abdomen limited · 0.18mm/px · 14 of 49 slices shown]
[im 1/49]
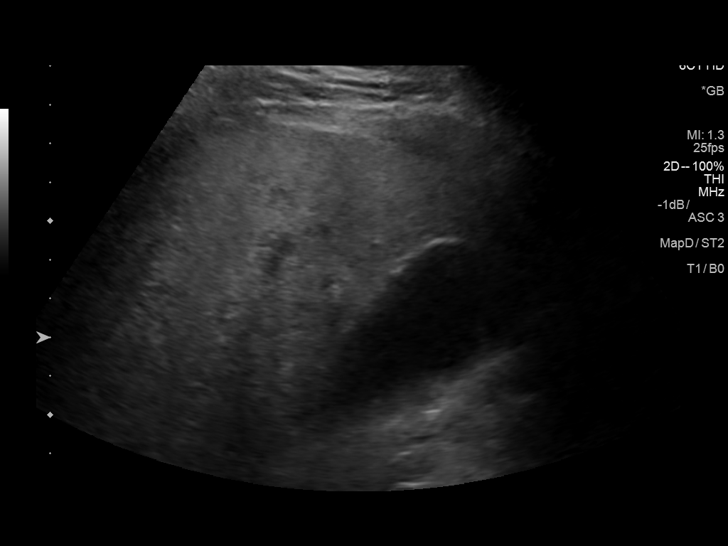
[im 5/49]
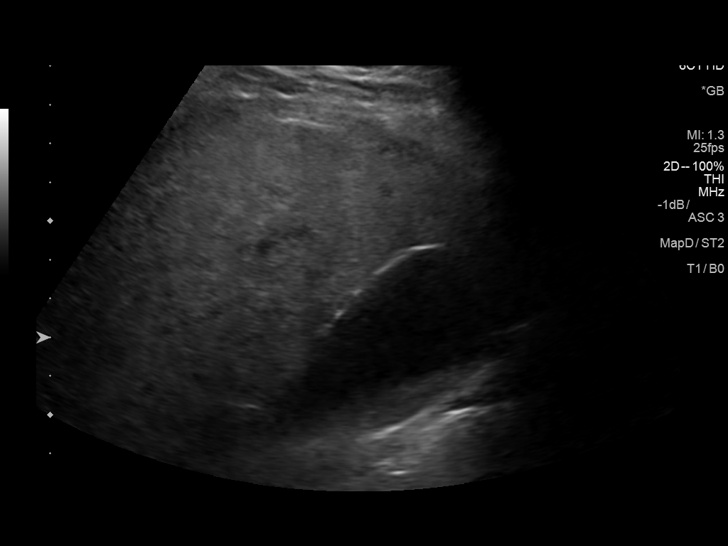
[im 9/49]
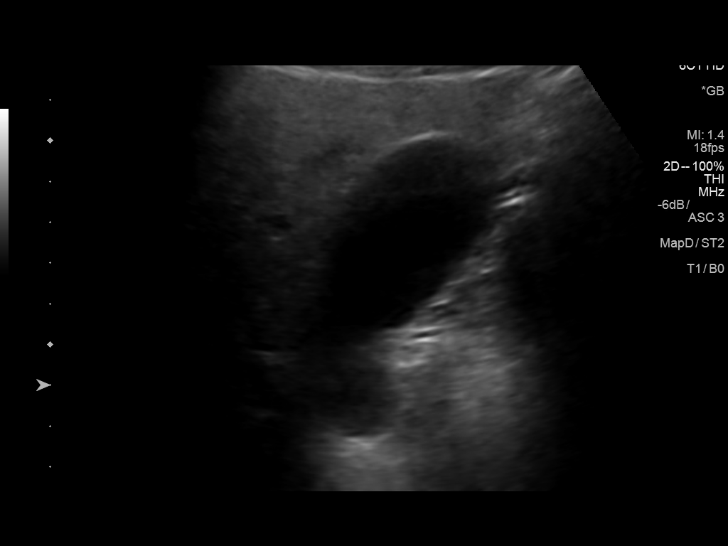
[im 13/49]
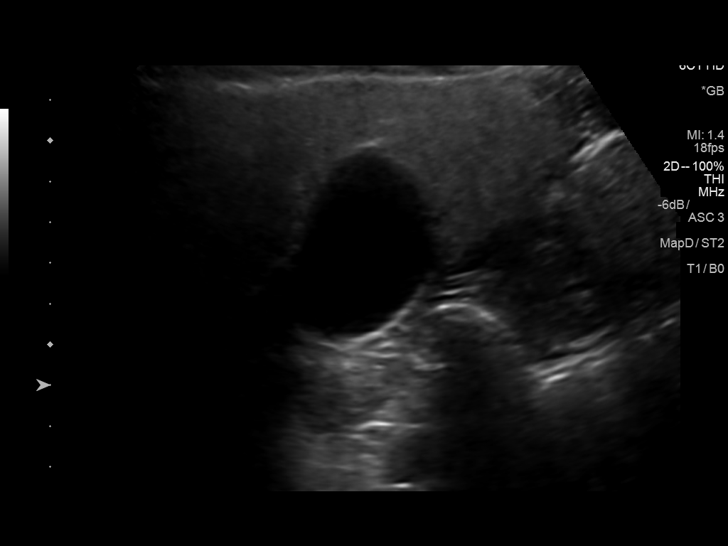
[im 17/49]
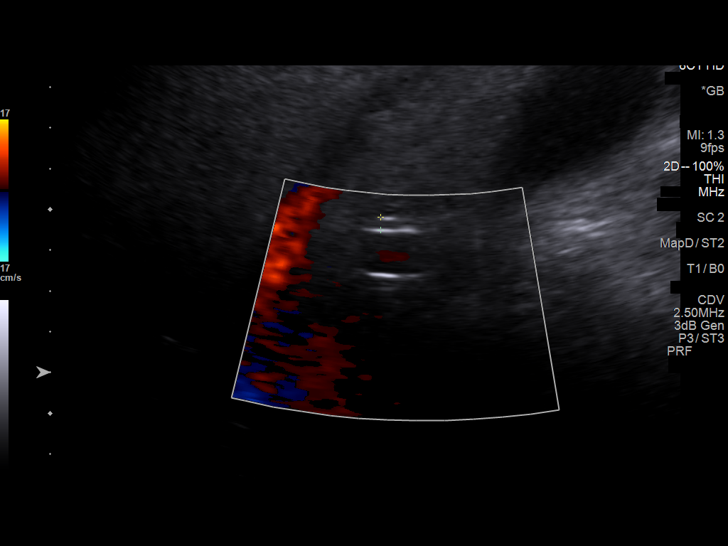
[im 19/49]
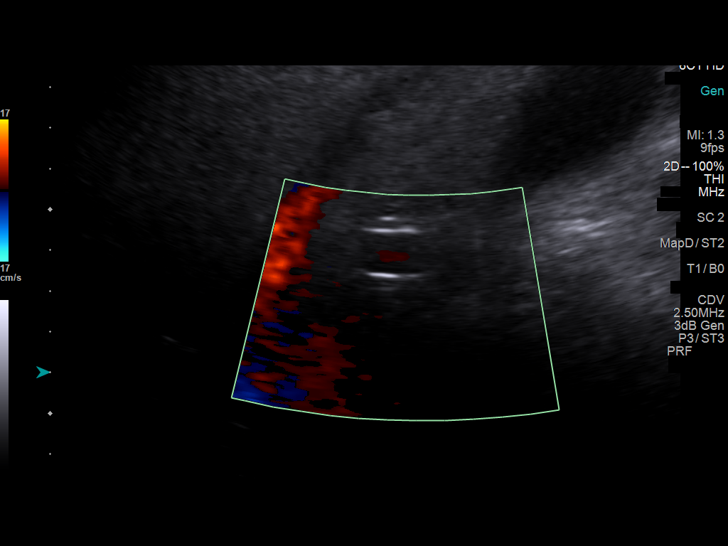
[im 23/49]
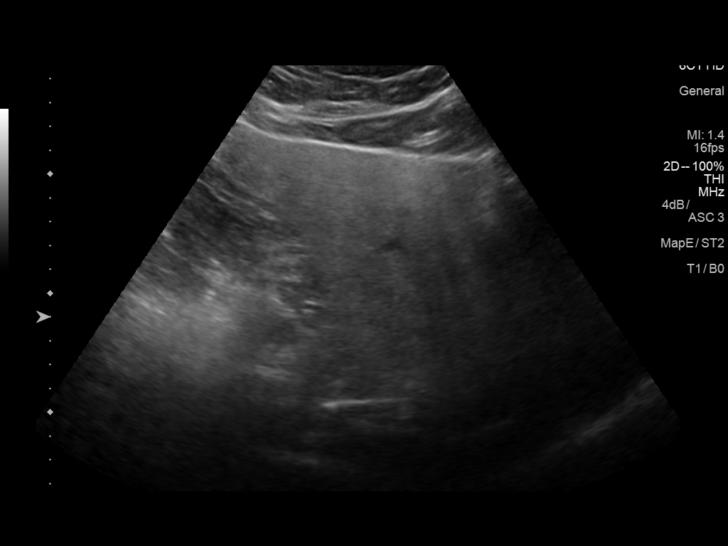
[im 27/49]
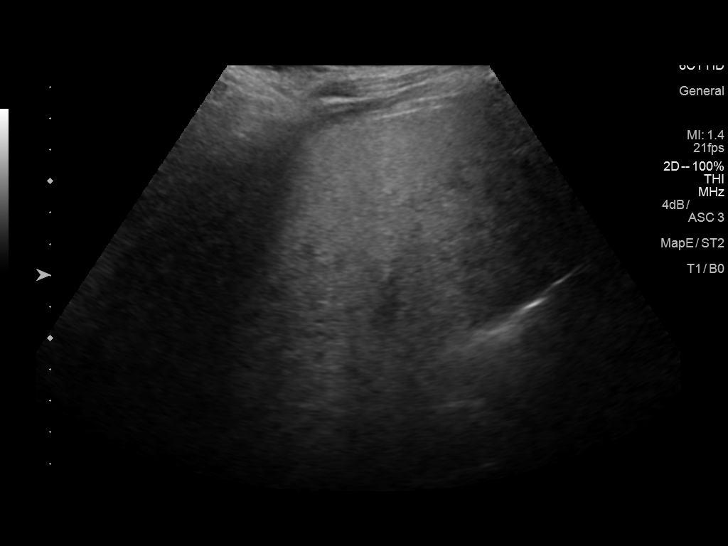
[im 31/49]
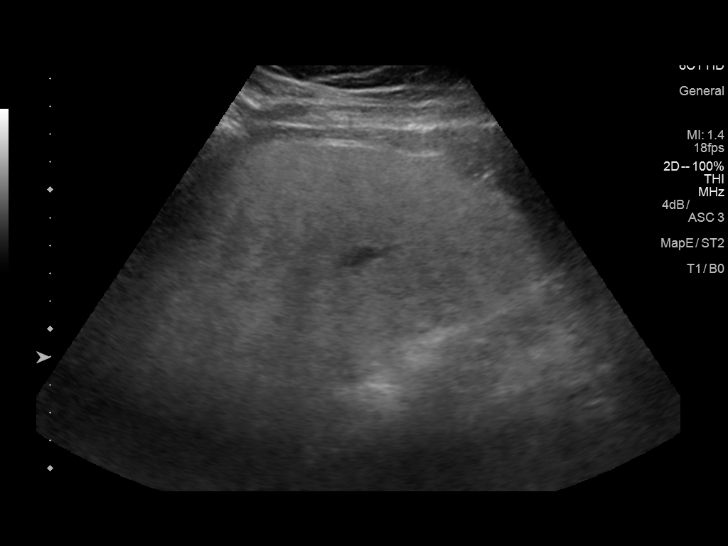
[im 33/49]
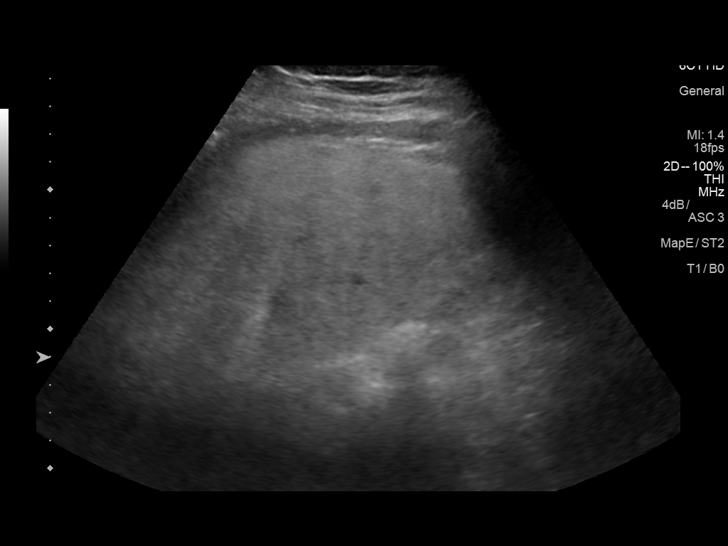
[im 37/49]
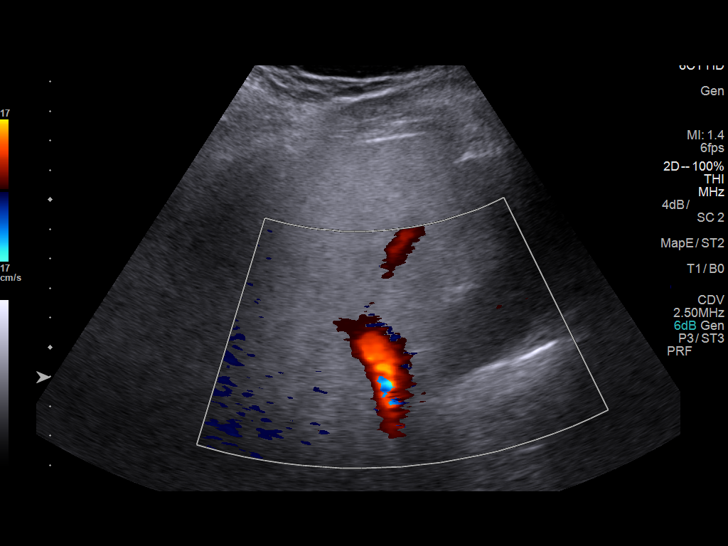
[im 41/49]
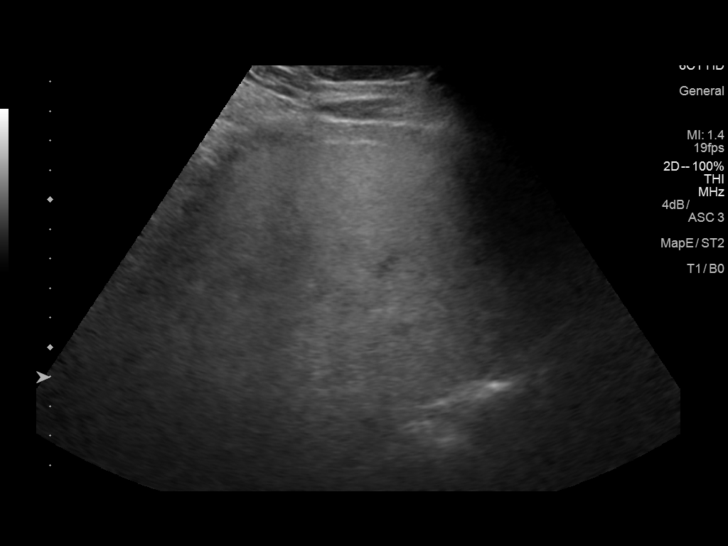
[im 45/49]
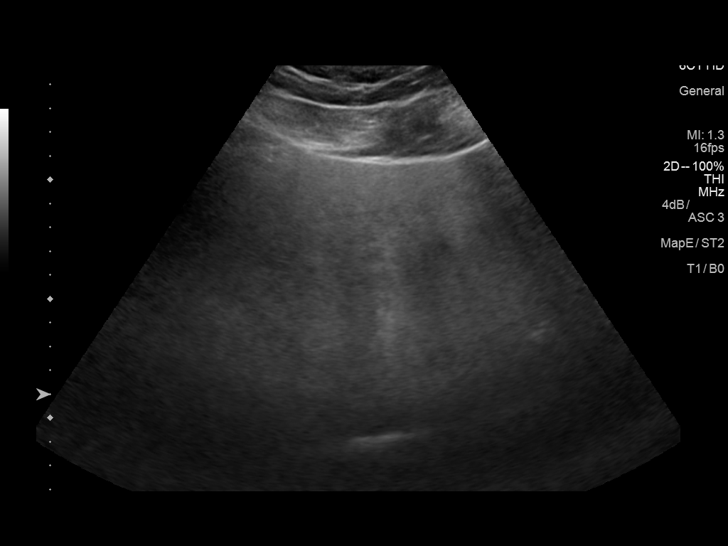
[im 49/49]
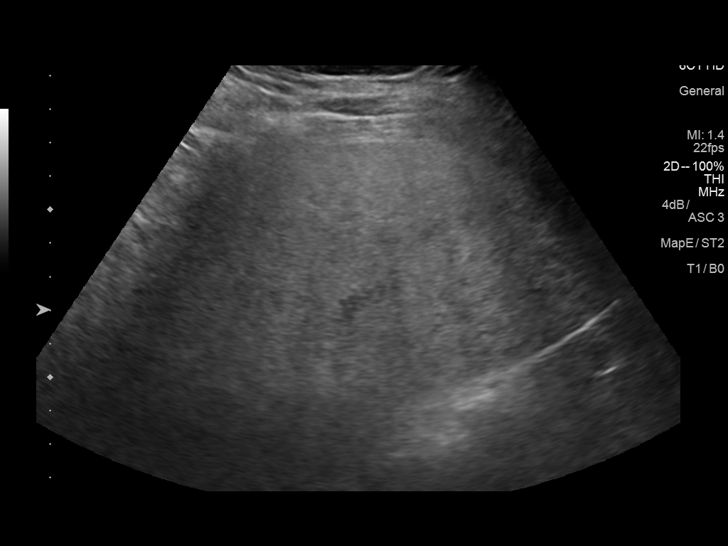

[14 of 25 positions shown; findings below may reference images not displayed]

FINDINGS: Gallbladder:

No gallstones or wall thickening visualized. No sonographic Murphy
sign noted by sonographer.

Common bile duct:

Diameter: 3 mm, normal

Liver:

Generalized increased liver echogenicity (image 27). Hepatopetal
portal vein flow (image 38). No discrete liver lesion or
intrahepatic biliary ductal dilatation.

Other findings: Negative visible right kidney.
IMPRESSION: Normal aside from fatty liver disease.

## 2019-06-21 DIAGNOSIS — Z0389 Encounter for observation for other suspected diseases and conditions ruled out: Secondary | ICD-10-CM | POA: Diagnosis not present

## 2019-07-05 ENCOUNTER — Other Ambulatory Visit: Payer: Self-pay | Admitting: Family Medicine

## 2019-07-05 DIAGNOSIS — I10 Essential (primary) hypertension: Secondary | ICD-10-CM

## 2019-07-05 NOTE — Telephone Encounter (Signed)
Pt was notified that he needs an appt for follow-up. Pt states hes been off meds for months. And he went to dentist recently and was high then. Pt will call back to schedule

## 2019-07-13 ENCOUNTER — Other Ambulatory Visit: Payer: Self-pay

## 2019-07-13 ENCOUNTER — Encounter: Payer: Self-pay | Admitting: Medical

## 2019-07-13 ENCOUNTER — Ambulatory Visit (INDEPENDENT_AMBULATORY_CARE_PROVIDER_SITE_OTHER): Payer: BC Managed Care – PPO | Admitting: Medical

## 2019-07-13 VITALS — BP 162/120 | HR 86 | Temp 97.8°F | Ht 65.0 in | Wt 269.0 lb

## 2019-07-13 DIAGNOSIS — G4733 Obstructive sleep apnea (adult) (pediatric): Secondary | ICD-10-CM

## 2019-07-13 DIAGNOSIS — K760 Fatty (change of) liver, not elsewhere classified: Secondary | ICD-10-CM | POA: Diagnosis not present

## 2019-07-13 DIAGNOSIS — R7989 Other specified abnormal findings of blood chemistry: Secondary | ICD-10-CM

## 2019-07-13 DIAGNOSIS — I10 Essential (primary) hypertension: Secondary | ICD-10-CM | POA: Diagnosis not present

## 2019-07-13 DIAGNOSIS — E782 Mixed hyperlipidemia: Secondary | ICD-10-CM

## 2019-07-13 DIAGNOSIS — R9431 Abnormal electrocardiogram [ECG] [EKG]: Secondary | ICD-10-CM

## 2019-07-13 DIAGNOSIS — R945 Abnormal results of liver function studies: Secondary | ICD-10-CM

## 2019-07-13 LAB — POCT URINALYSIS DIP (PROADVANTAGE DEVICE)
Bilirubin, UA: NEGATIVE
Blood, UA: NEGATIVE
Glucose, UA: NEGATIVE mg/dL
Ketones, POC UA: NEGATIVE mg/dL
Leukocytes, UA: NEGATIVE
Nitrite, UA: NEGATIVE
Specific Gravity, Urine: 1.03
Urobilinogen, Ur: NEGATIVE
pH, UA: 6 (ref 5.0–8.0)

## 2019-07-13 MED ORDER — OLMESARTAN MEDOXOMIL-HCTZ 20-12.5 MG PO TABS: 1.0000 | ORAL_TABLET | Freq: Every day | ORAL | 2 refills | Status: DC

## 2019-07-13 NOTE — Patient Instructions (Signed)
Your blood pressure today was 162/120.     Begin Olmesartan HCT blood pressure pill today.   Take one now when you get it, and take a 1/2 tonight when you get ready for work.   Starting Saturday night take 1 tablet daily  If your blood pressure this evening is <100 on the bottom, then hold off on the evening dose just for today.   Otherwise, if the pressure is still quite elevated like it is here today, then take the 1/2 tablet tonight  Cut back on salt, junk food, fast food.   Use your CPAP nightly.   follow up in 1-2 weeks with Malik Jackson.  Follow up in 1 week if the bottom number is still close to 120.

## 2019-07-13 NOTE — Progress Notes (Signed)
Subjective:  Malik Jackson is a 29 y.o. male who presents for Chief Complaint  Patient presents with  . Hypertension     Here today for concerns about high blood pressure.  He notes a history of high blood pressure was on medicine over a year ago but decided to stop it after his home numbers were improving with lifestyle changes and weight loss.  However in the past year he has gained at least 10+ pounds.  He also quit using his CPAP at some point in the last year.  He had felt fine but went to have a dental visit recently and his blood pressure at the dentist was 180/111.  Was told to follow-up with primary care.   He denies chest pain, palpitations, edema, no discolored urine.  otherwise in normal state of health.  He has a grandfather that died of heart attack in his 26s but no other heart disease in the family.  No other aggravating or relieving factors.    No other c/o.  Past Medical History:  Diagnosis Date  . Abnormal liver function tests 03/28/2019  . HTN (hypertension)    diagnosed 2 years ago  . Morbid obesity ([place])   . OSA on CPAP    No current outpatient medications on file prior to visit.   No current facility-administered medications on file prior to visit.     The following portions of the patient's history were reviewed and updated as appropriate: allergies, current medications, past family history, past medical history, past social history, past surgical history and problem list.  ROS Otherwise as in subjective above   Objective: BP (!) 162/120   Pulse 86   Temp 97.8 F (36.6 C)   Ht 5\' 5"  (1.651 m)   Wt 269 lb (122 kg)   SpO2 97%   BMI 44.76 kg/m    General appearance: alert, no distress, well developed, well nourished Neck: supple, no lymphadenopathy, no thyromegaly, no masses, no bruits Heart: RRR, normal S1, S2, no murmurs Lungs: CTA bilaterally, no wheezes, rhonchi, or rales Abdomen: +bs, soft, non tender, non distended, no masses, no hepatomegaly,  no splenomegaly Pulses: 2+ radial pulses, 2+ pedal pulses, normal cap refill Ext: no edema  EKG  Indication high blood pressure, rate 74 bpm, PR 134 ms, QRS 88 ms, QTC 448 ms, axis 62 degrees, normal sinus rhythm, T wave inversion V6, poor R wave progression, no prior EKG   Assessment: Encounter Diagnoses  Name Primary?  . Essential hypertension Yes  . OSA (obstructive sleep apnea)   . Fatty liver   . Morbid obesity ([place])   . Mixed hyperlipidemia   . Abnormal liver function tests   . Abnormal EKG      Plan: We discussed the risk of uncontrolled high blood pressure.  Discussed diagnosis of high blood pressure.  He works second shift but is here this morning at 9:00 AM.  I will start him on medication below.  Discussed risk and benefits of medication.  Discussed follow-up.  Counseled on need to work on lifestyle changes, weight loss, and be compliant with CPAP which he has not been until a week ago.  Answered his questions.  Reviewed the handout as below with him: Your blood pressure today was 162/120.     Begin Olmesartan HCT blood pressure pill today.   Take one now when you get it, and take a 1/2 tonight when you get ready for work.   Starting Saturday night take 1 tablet daily  If your blood pressure this evening is <100 on the bottom, then hold off on the evening dose just for today.   Otherwise, if the pressure is still quite elevated like it is here today, then take the 1/2 tablet tonight  Cut back on salt, junk food, fast food.   Use your CPAP nightly.   follow up in 1-2 weeks with Malik Jackson, his PCP here.  Follow up in 1 week if the bottom number is still close to 120.  Consider cardiology consult.    Malik Jackson was seen today for hypertension.  Diagnoses and all orders for this visit:  Essential hypertension -     EKG 12-Lead -     POCT Urinalysis DIP (Proadvantage Device)  OSA (obstructive sleep apnea)  Fatty liver  Morbid obesity ([facility])  Mixed  hyperlipidemia  Abnormal liver function tests  Abnormal EKG  Other orders -     olmesartan-hydrochlorothiazide (BENICAR HCT) 20-12.5 MG tablet; Take 1 tablet by mouth daily.    Follow up: 1 week

## 2019-07-16 ENCOUNTER — Ambulatory Visit (INDEPENDENT_AMBULATORY_CARE_PROVIDER_SITE_OTHER): Payer: BC Managed Care – PPO | Admitting: Family Medicine

## 2019-07-23 ENCOUNTER — Other Ambulatory Visit: Payer: Self-pay

## 2019-07-23 ENCOUNTER — Encounter (INDEPENDENT_AMBULATORY_CARE_PROVIDER_SITE_OTHER): Payer: Self-pay | Admitting: Bariatrics

## 2019-07-23 ENCOUNTER — Ambulatory Visit (INDEPENDENT_AMBULATORY_CARE_PROVIDER_SITE_OTHER): Payer: BC Managed Care – PPO | Admitting: Bariatrics

## 2019-07-23 VITALS — BP 114/79 | HR 85 | Temp 98.1°F | Ht 65.0 in | Wt 263.0 lb

## 2019-07-23 DIAGNOSIS — G4733 Obstructive sleep apnea (adult) (pediatric): Secondary | ICD-10-CM | POA: Diagnosis not present

## 2019-07-23 DIAGNOSIS — E161 Other hypoglycemia: Secondary | ICD-10-CM | POA: Diagnosis not present

## 2019-07-23 DIAGNOSIS — Z9189 Other specified personal risk factors, not elsewhere classified: Secondary | ICD-10-CM | POA: Diagnosis not present

## 2019-07-23 DIAGNOSIS — I10 Essential (primary) hypertension: Secondary | ICD-10-CM

## 2019-07-23 DIAGNOSIS — R7303 Prediabetes: Secondary | ICD-10-CM

## 2019-07-23 DIAGNOSIS — R0602 Shortness of breath: Secondary | ICD-10-CM | POA: Diagnosis not present

## 2019-07-23 DIAGNOSIS — R5383 Other fatigue: Secondary | ICD-10-CM

## 2019-07-23 DIAGNOSIS — Z6841 Body Mass Index (BMI) 40.0 and over, adult: Secondary | ICD-10-CM

## 2019-07-23 DIAGNOSIS — E782 Mixed hyperlipidemia: Secondary | ICD-10-CM

## 2019-07-23 DIAGNOSIS — F3289 Other specified depressive episodes: Secondary | ICD-10-CM | POA: Diagnosis not present

## 2019-07-23 DIAGNOSIS — R7309 Other abnormal glucose: Secondary | ICD-10-CM | POA: Diagnosis not present

## 2019-07-23 DIAGNOSIS — Z0289 Encounter for other administrative examinations: Secondary | ICD-10-CM

## 2019-07-23 DIAGNOSIS — K760 Fatty (change of) liver, not elsewhere classified: Secondary | ICD-10-CM

## 2019-07-23 DIAGNOSIS — E559 Vitamin D deficiency, unspecified: Secondary | ICD-10-CM | POA: Diagnosis not present
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
## 2019-07-23 NOTE — Progress Notes (Signed)
Chief Complaint:   OBESITY Malik Jackson (MR# [id number]) is a 29 y.o. male who presents for evaluation and treatment of obesity and related comorbidities. Current BMI is Body mass index is 43.77 kg/m.Malik Jackson has been struggling with his weight for many years and has been unsuccessful in either losing weight, maintaining weight loss, or reaching his healthy weight goal.  Malik Jackson is currently in the action stage of change and ready to dedicate time achieving and maintaining a healthier weight. Malik Jackson is interested in becoming our patient and working on intensive lifestyle modifications including (but not limited to) diet and exercise for weight loss.  Malik Jackson does not like to cook. He does crave more food when he is stressed/anxious. He works night shift. He skips breakfast.  Malik Jackson habits were reviewed today and are as follows: he thinks his family will eat healthier with him, his desired weight loss is 83 lbs, he started gaining weight in his mid to late 20's, his heaviest weight ever was 270 pounds, he craves his mom's cooking, he snacks frequently in the evenings, he skips breakfast frequently, he is frequently drinking liquids with calories, he frequently makes poor food choices, he has problems with excessive hunger when stressed, he frequently eats larger portions than normal, he has binge eating behaviors and he struggles with emotional eating.  Depression Screen Malik Jackson Food and Mood (modified PHQ-9) score was 15.  Depression screen PHQ 2/9 07/23/2019  Decreased Interest 1  Down, Depressed, Hopeless 1  PHQ - 2 Score 2  Altered sleeping 2  Tired, decreased energy 2  Change in appetite 3  Feeling bad or failure about yourself  2  Trouble concentrating 3  Moving slowly or fidgety/restless 1  Suicidal thoughts 0  PHQ-9 Score 15   Subjective:   Other fatigue. Malik Jackson denies daytime somnolence and admits to waking up still tired. Malik Jackson generally gets 4-5 hours of sleep per  night, and states that he does not sleep well most nights. Snoring is present. Apneic episodes are not present. Epworth Sleepiness Score is 3.  SOB (shortness of breath) on exertion. Malik Jackson notes increasing shortness of breath with certain activities and seems to be worsening over time with weight gain. He notes getting out of breath sooner with activity than he used to. This has gotten worse recently. Malik Jackson denies shortness of breath at rest or orthopnea.  Essential hypertension. Blood pressure is well controlled. No lightheadedness.  BP Readings from Last 3 Encounters:  07/23/19 114/79  07/13/19 (!) 162/120  05/08/19 (!) 186/110   Lab Results  Component Value Date   CREATININE 1.05 03/27/2019   CREATININE 0.96 03/22/2019   CREATININE 0.99 12/21/2017   OSA (obstructive sleep apnea). Malik Jackson is using CPAP. He reports still having some fatigue (works night shift).  Fatty liver. Ultrasound on 10/11/2016 showed increased liver echogenicity. ALT was 61 on 05/08/2019.  Mixed hyperlipidemia. Malik Jackson has hyperlipidemia and has been trying to improve his cholesterol levels with intensive lifestyle modification including a low saturated fat diet, exercise and weight loss. He denies any chest pain, claudication or myalgias. Malik Jackson is on no medications.  Lab Results  Component Value Date   ALT 61 (H) 05/08/2019   AST 29 05/08/2019   ALKPHOS 130 (H) 05/08/2019   BILITOT 0.4 05/08/2019   Lab Results  Component Value Date   CHOL 227 (H) 03/27/2019   HDL 43 03/27/2019   LDLCALC 150 (H) 03/27/2019   TRIG 186 (H) 03/27/2019   CHOLHDL 5.3 (H) 03/27/2019  Prediabetes. Malik Jackson has a diagnosis of prediabetes based on his elevated HgA1c and was informed this puts him at greater risk of developing diabetes. He continues to work on diet and exercise to decrease his risk of diabetes. He denies nausea or hypoglycemia. Glucose 101 on 03/27/2019.  Lab Results  Component Value Date   HGBA1C 5.8 (H)  03/22/2019   No results found for: INSULIN  Other depression, with emotional eating. Malik Jackson is struggling with emotional eating and using food for comfort to the extent that it is negatively impacting his health. He has been working on behavior modification techniques to help reduce his emotional eating and has been somewhat successful. He shows no sign of suicidal or homicidal ideations.  At risk for impaired function of liver. Malik Jackson is at risk for impaired function of liver due to likely diagnosis of fatty liver as evidenced by recent elevated liver enzymes and obesity.   Assessment/Plan:   Other fatigue. Malik Jackson does feel that his weight is causing his energy to be lower than it should be. Fatigue may be related to obesity, depression or many other causes. Labs will be ordered, and in the meanwhile, Malik Jackson will focus on self care including making healthy food choices, increasing physical activity and focusing on stress reduction. VITAMIN D 25 Hydroxy (Vit-D Deficiency, Fractures) levels ordered.  SOB (shortness of breath) on exertion. Malik Jackson does feel that he gets out of breath more easily that he used to when he exercises. Malik Jackson shortness of breath appears to be obesity related and exercise induced. He has agreed to work on weight loss and gradually increase exercise to treat his exercise induced shortness of breath. Will continue to monitor closely.  Essential hypertension. Malik Jackson is working on healthy weight loss and exercise to improve blood pressure control. We will watch for signs of hypotension as he continues his lifestyle modifications. He will continue medications as directed. Comprehensive metabolic panel ordered today.  OSA (obstructive sleep apnea). Intensive lifestyle modifications are the first line treatment for this issue. We discussed several lifestyle modifications today and he will continue to work on diet, exercise and weight loss efforts. We will continue to monitor.  Orders and follow up as documented in patient record. Malik Jackson will use his CPAP nightly.  Counseling  Sleep apnea is a condition in which breathing pauses or becomes shallow during sleep. This happens over and over during the night. This disrupts your sleep and keeps your body from getting the rest that it needs, which can cause tiredness and lack of energy (fatigue) during the day.  Sleep apnea treatment: If you were given a device to open your airway while you sleep, USE IT!  Sleep hygiene:   Limit or avoid alcohol, caffeinated beverages, and cigarettes, especially close to bedtime.   Do not eat a large meal or eat spicy foods right before bedtime. This can lead to digestive discomfort that can make it hard for you to sleep.  Keep a sleep diary to help you and your health care provider figure out what could be causing your insomnia.  . Make your bedroom a dark, comfortable place where it is easy to fall asleep. ? Put up shades or blackout curtains to block light from outside. ? Use a white noise machine to block noise. ? Keep the temperature cool. . Limit screen use before bedtime. This includes: ? Watching TV. ? Using your smartphone, tablet, or computer. . Stick to a routine that includes going to bed and waking up at the  same times every day and night. This can help you fall asleep faster. Consider making a quiet activity, such as reading, part of your nighttime routine. . Try to avoid taking naps during the day so that you sleep better at night. . Get out of bed if you are still awake after 15 minutes of trying to sleep. Keep the lights down, but try reading or doing a quiet activity. When you feel sleepy, go back to bed.  Fatty liver. Will watch labs over time. CMP ordered today.  Mixed hyperlipidemia. Cardiovascular risk and specific lipid/LDL goals reviewed.  We discussed several lifestyle modifications today and Malik Jackson will continue to work on diet, exercise and weight loss  efforts. Orders and follow up as documented in patient record. He will decrease saturated fats and avoid trans fats.  Counseling Intensive lifestyle modifications are the first line treatment for this issue. . Dietary changes: Increase soluble fiber. Decrease simple carbohydrates. . Exercise changes: Moderate to vigorous-intensity aerobic activity 150 minutes per week if tolerated. . Lipid-lowering medications: see documented in medical record.  Prediabetes. Malik Jackson will continue to work on weight loss, exercise, and decreasing simple carbohydrates to help decrease the risk of diabetes. Insulin, random ordered today.  Other depression, with emotional eating. Behavior modification techniques were discussed today to help Malik Jackson deal with his emotional/non-hunger eating behaviors.  Orders and follow up as documented in patient record. Malik Jackson will follow-up with Malik Jackson.  At risk for impaired function of liver. Emrick was given approximately 15 minutes of counseling today regarding prevention of impaired liver function. Bassam was educated about his risk of developing NASH or even liver failure and advised that the only proven treatment for NAFLD was weight loss of at least 5-10% of body weight.   Class 3 severe obesity with serious comorbidity and body mass index (BMI) of 40.0 to 44.9 in adult, unspecified obesity type ([place]).  Samarth is currently in the action stage of change and his goal is to continue with weight loss efforts. I recommend Elonzo begin the structured treatment plan as follows:  He has agreed to the Category 4 Plan and journal 1800 calories and 90-115 grams of protein daily.  We independently reviewed with the patient labs from 05/08/2019 including AST, ALT, and iron and labs from 03/27/2019 including CMP, lipids, and glucose.  He will stop all sugary drinks and will work on meal planning.  Exercise goals: All adults should avoid inactivity. Some physical activity is better  than none, and adults who participate in any amount of physical activity gain some health benefits.   Behavioral modification strategies: increasing lean protein intake, decreasing simple carbohydrates, increasing vegetables, increasing water intake, decreasing eating out, no skipping meals, meal planning and cooking strategies, keeping healthy foods in the home and avoiding temptations.  He was informed of the importance of frequent follow-up visits to maximize his success with intensive lifestyle modifications for his multiple health conditions. He was informed we would discuss his lab results at his next visit unless there is a critical issue that needs to be addressed sooner. Reynald agreed to keep his next visit at the agreed upon time to discuss these results.  Objective:   Blood pressure 114/79, pulse 85, temperature 98.1 F (36.7 C), temperature source Oral, height 5\' 5"  (1.651 m), weight 263 lb (119.3 kg), SpO2 96 %. Body mass index is 43.77 kg/m.  EKG:  EKG was performed 07/13/2018 showing normal sinus rhythm, rate 72 BPM. Nonspecific T-wave abnormality. Abnormal ECG.  Indirect  Calorimeter completed today shows a VO2 of 335 and a REE of 2334.  His calculated basal metabolic rate is 5945 thus his basal metabolic rate is worse than expected.  General: Cooperative, alert, well developed, in no acute distress. HEENT: Conjunctivae and lids unremarkable. Cardiovascular: Regular rhythm.  Lungs: Normal work of breathing. Neurologic: No focal deficits.   Lab Results  Component Value Date   CREATININE 1.05 03/27/2019   BUN 12 03/27/2019   NA 141 03/27/2019   K 3.8 03/27/2019   CL 100 03/27/2019   CO2 25 03/27/2019   Lab Results  Component Value Date   ALT 61 (H) 05/08/2019   AST 29 05/08/2019   ALKPHOS 130 (H) 05/08/2019   BILITOT 0.4 05/08/2019   Lab Results  Component Value Date   HGBA1C 5.8 (H) 03/22/2019   HGBA1C 5.6 12/21/2017   HGBA1C 5.3 08/16/2016   No results found  for: INSULIN Lab Results  Component Value Date   TSH 2.380 03/22/2019   Lab Results  Component Value Date   CHOL 227 (H) 03/27/2019   HDL 43 03/27/2019   LDLCALC 150 (H) 03/27/2019   TRIG 186 (H) 03/27/2019   CHOLHDL 5.3 (H) 03/27/2019   Lab Results  Component Value Date   WBC 7.4 03/22/2019   HGB 17.6 03/22/2019   HCT 50.2 03/22/2019   MCV 87 03/22/2019   PLT 234 03/22/2019   Lab Results  Component Value Date   IRON 96 05/08/2019   FERRITIN 257.4 05/08/2019   Attestation Statements:   Reviewed by clinician on day of visit: allergies, medications, problem list, medical history, surgical history, family history, social history, and previous encounter notes.  Fernanda Drum, am acting as Energy manager for [employer], DO   I have reviewed the above documentation for accuracy and completeness, and I agree with the above. Corinna Capra, DO

## 2019-07-24 ENCOUNTER — Encounter (INDEPENDENT_AMBULATORY_CARE_PROVIDER_SITE_OTHER): Payer: Self-pay | Admitting: Bariatrics

## 2019-07-24 DIAGNOSIS — E559 Vitamin D deficiency, unspecified: Secondary | ICD-10-CM | POA: Insufficient documentation

## 2019-07-24 LAB — COMPREHENSIVE METABOLIC PANEL
ALT: 97 IU/L — ABNORMAL HIGH (ref 0–44)
AST: 42 IU/L — ABNORMAL HIGH (ref 0–40)
Albumin/Globulin Ratio: 1.9 (ref 1.2–2.2)
Albumin: 4.8 g/dL (ref 4.0–5.0)
Alkaline Phosphatase: 130 IU/L — ABNORMAL HIGH (ref 39–117)
BUN/Creatinine Ratio: 16 (ref 9–20)
BUN: 16 mg/dL (ref 6–20)
Bilirubin Total: 0.4 mg/dL (ref 0.0–1.2)
CO2: 24 mmol/L (ref 20–29)
Calcium: 9.3 mg/dL (ref 8.7–10.2)
Chloride: 102 mmol/L (ref 96–106)
Creatinine, Ser: 1.01 mg/dL (ref 0.76–1.27)
GFR calc Af Amer: 114 mL/min/{1.73_m2} (ref >59)
GFR calc non Af Amer: 99 mL/min/{1.73_m2} (ref >59)
Globulin, Total: 2.5 g/dL (ref 1.5–4.5)
Glucose: 104 mg/dL — ABNORMAL HIGH (ref 65–99)
Potassium: 4.2 mmol/L (ref 3.5–5.2)
Sodium: 143 mmol/L (ref 134–144)
Total Protein: 7.3 g/dL (ref 6.0–8.5)

## 2019-07-24 LAB — VITAMIN D 25 HYDROXY (VIT D DEFICIENCY, FRACTURES): Vit D, 25-Hydroxy: 13.1 ng/mL — ABNORMAL LOW (ref 30.0–100.0)

## 2019-07-24 LAB — INSULIN, RANDOM: INSULIN: 45 u[IU]/mL — ABNORMAL HIGH (ref 2.6–24.9)

## 2019-07-26 ENCOUNTER — Ambulatory Visit (INDEPENDENT_AMBULATORY_CARE_PROVIDER_SITE_OTHER): Payer: BC Managed Care – PPO | Admitting: Psychology

## 2019-07-26 ENCOUNTER — Other Ambulatory Visit: Payer: Self-pay

## 2019-07-26 DIAGNOSIS — F3289 Other specified depressive episodes: Secondary | ICD-10-CM

## 2019-07-26 NOTE — Progress Notes (Signed)
  Office: [phone]  /  Fax: [phone]    Date: August 09, 2019   Appointment Start Time: 2:30pm Duration: 28 minutes Provider: Lawerance Cruel, Psy.D. Type of Session: Individual Therapy  Location of Patient: Home Location of Provider: Provider's Home Type of Contact: Telepsychological Visit via [employer]  Session Content: Malik Jackson is a 29 y.o. male presenting via [employer] for a follow-up appointment to address the previously established treatment goal of decreasing emotional eating. Today's appointment was a telepsychological visit due to COVID-19. Malik Jackson provided verbal consent for today's telepsychological appointment and he is aware he is responsible for securing confidentiality on his end of the session. Prior to proceeding with today's appointment, Malik Jackson's physical location at the time of this appointment was obtained as well a phone number he could be reached at in the event of technical difficulties. Malik Jackson and this provider participated in today's telepsychological service.   This provider conducted a brief check-in. Malik Jackson reported he lost three pounds. He reported "things are ramping up at work" resulting in "mandatory overtime." Despite the changes, he stated he continues to do well with his meal plan. Malik Jackson reported he may be going vacation and expressed concern about his ability to continue eating congruent to his meal plan. Thus, strategies (e.g., taking food on the trip and for the hotel, focusing on protein and vegetable intake, engaging in portion control, focusing on hydration intake) were discussed. Moreover, psychoeducation regarding triggers for emotional eating was provided. Malik Jackson was provided a handout, and encouraged to utilize the handout between now and the next appointment to increase awareness of triggers and frequency. Malik Jackson agreed. This provider also discussed behavioral strategies for specific triggers, such as placing the utensil down when conversing to avoid  mindless eating. Malik Jackson provided verbal consent during today's appointment for this provider to send a handout about triggers via e-mail. Malik Jackson was receptive to today's appointment as evidenced by openness to sharing, responsiveness to feedback, and willingness to explore triggers for emotional eating.  Mental Status Examination:  Appearance: well groomed and appropriate hygiene  Behavior: appropriate to circumstances Mood: euthymic Affect: mood congruent Speech: normal in rate, volume, and tone Eye Contact: appropriate Psychomotor Activity: appropriate Gait: unable to assess Thought Process: linear, logical, and goal directed  Thought Content/Perception: no hallucinations, delusions, bizarre thinking or behavior reported or observed and no evidence of suicidal and homicidal ideation, plan, and intent Orientation: time, person, place and purpose of appointment Memory/Concentration: memory, attention, language, and fund of knowledge intact  Insight/Judgment: good  Interventions:  Conducted a brief chart review Provided empathic reflections and validation Reviewed content from the previous session Employed supportive psychotherapy interventions to facilitate reduced distress and to improve coping skills with identified stressors Employed motivational interviewing skills to assess patient's willingness/desire to adhere to recommended medical treatments and assignments Psychoeducation provided regarding triggers for emotional eating  DSM-5 Diagnosis(es): 311 (F32.8) Other Specified Depressive Disorder, Emotional Eating Behaviors  Treatment Goal & Progress: During the initial appointment with this provider, the following treatment goal was established: decrease emotional eating. Progress is limited, as Malik Jackson has just begun treatment with this provider; however, he is receptive to the interaction and interventions and rapport is being established.   Plan: The next appointment will be  scheduled in approximately two weeks, which will be via MyChart Video Visit. The next session will focus on working towards the established treatment goal.

## 2019-08-06 ENCOUNTER — Other Ambulatory Visit: Payer: Self-pay

## 2019-08-06 ENCOUNTER — Ambulatory Visit (INDEPENDENT_AMBULATORY_CARE_PROVIDER_SITE_OTHER): Payer: BC Managed Care – PPO | Admitting: Bariatrics

## 2019-08-06 ENCOUNTER — Encounter (INDEPENDENT_AMBULATORY_CARE_PROVIDER_SITE_OTHER): Payer: Self-pay | Admitting: Bariatrics

## 2019-08-06 VITALS — BP 122/75 | HR 83 | Temp 98.4°F | Ht 65.0 in | Wt 260.0 lb

## 2019-08-06 DIAGNOSIS — R748 Abnormal levels of other serum enzymes: Secondary | ICD-10-CM

## 2019-08-06 DIAGNOSIS — Z6841 Body Mass Index (BMI) 40.0 and over, adult: Secondary | ICD-10-CM

## 2019-08-06 DIAGNOSIS — R7303 Prediabetes: Secondary | ICD-10-CM

## 2019-08-06 DIAGNOSIS — Z9189 Other specified personal risk factors, not elsewhere classified: Secondary | ICD-10-CM | POA: Diagnosis not present

## 2019-08-06 DIAGNOSIS — E559 Vitamin D deficiency, unspecified: Secondary | ICD-10-CM | POA: Diagnosis not present

## 2019-08-06 MED ORDER — VITAMIN D (ERGOCALCIFEROL) 1.25 MG (50000 UNIT) PO CAPS: 50000.0000 [IU] | ORAL_CAPSULE | ORAL | 0 refills | Status: DC

## 2019-08-06 NOTE — Progress Notes (Signed)
Chief Complaint:   OBESITY Malik Jackson is here to discuss his progress with his obesity treatment plan along with follow-up of his obesity related diagnoses. [name] is keeping a food journal and adhering to recommended goals of 1800 calories and 90-115 grams of protein and states he is following his eating plan approximately 80% of the time. [name] states he is walking 30 minutes 3-4 times per week.  Today's visit was #: 2 Starting weight: 263 lbs Starting date: 07/23/2019 Today's weight: 260 Today's date: 08/06/2019 Total lbs lost to date: 3 Total lbs lost since last in-office visit: 3  Interim History: [name] is down 3 lbs. He liked the food. His schedule made it hard to eat at work at times. He felt satisfied with his meals. He reports getting adequate water intake.  Subjective:   Prediabetes. [name] has a diagnosis of prediabetes based on his elevated HgA1c and was informed this puts him at greater risk of developing diabetes. He continues to work on diet and exercise to decrease his risk of diabetes. He denies nausea or hypoglycemia. Appetite controlled.  Lab Results  Component Value Date   HGBA1C 5.8 (H) 03/22/2019   Lab Results  Component Value Date   INSULIN 45.0 (H) 07/23/2019   Vitamin D deficiency. Chaska is not taking Vitamin D supplementation. Last Vitamin D 13.1 on 07/23/2019.  Elevated liver enzymes. [name] has a new dx of elevated ALT. His BMI is over 40. He denies abdominal pain or jaundice and has never been told of any liver problems in the past. He denies excessive alcohol intake. He has a diagnosis of fatty liver.  Lab Results  Component Value Date   ALT 97 (H) 07/23/2019   AST 42 (H) 07/23/2019   ALKPHOS 130 (H) 07/23/2019   BILITOT 0.4 07/23/2019   At risk for osteoporosis. Dryden is at higher risk of osteopenia and osteoporosis due to Vitamin D deficiency.   Assessment/Plan:   Prediabetes. Korbin will continue to work on weight loss,  exercise, increasing healthy fats and protein, and decreasing simple carbohydrates to help decrease the risk of diabetes. Handout was given on Insulin Resistance and Prediabetes.  Vitamin D deficiency. Low Vitamin D level contributes to fatigue and are associated with obesity, breast, and colon cancer. He was given a prescription for Vitamin D @50 ,000 IU every week #4 with 0 refills and will follow-up for routine testing of Vitamin D, at least 2-3 times per year to avoid over-replacement.  Elevated liver enzymes. We discussed the likely diagnosis of non-alcoholic fatty liver disease today and how this condition is obesity related. [name] was educated the importance of weight loss. [name] agreed to continue with his weight loss efforts with healthier diet and exercise as an essential part of his treatment plan. Will check liver enzymes in 6 weeks.  At risk for osteoporosis. [name] was given approximately 15 minutes of osteoporosis prevention counseling today. [name] is at risk for osteopenia and osteoporosis due to his Vitamin D deficiency. He was encouraged to take his Vitamin D and follow his higher calcium diet and increase strengthening exercise to help strengthen his bones and decrease his risk of osteopenia and osteoporosis.  Repetitive spaced learning was employed today to elicit superior memory formation and behavioral change.  Class 3 severe obesity with serious comorbidity and body mass index (BMI) of 40.0 to 44.9 in adult, unspecified obesity type ([facility]).  [name] is currently in the action stage of change. As such, his goal is to continue  with weight loss efforts. He has agreed to keeping a food journal and adhering to recommended goals of 1800 calories and 90-115 grams of protein.   He will work on meal planning.  We independently reviewed with the patient labs from 07/23/2019 including CMP, Vitamin D, insulin, and glucose.  Exercise goals: [name] will continue walking 30 minutes 3-4  times per week.  Behavioral modification strategies: increasing lean protein intake, decreasing simple carbohydrates, increasing vegetables, increasing water intake, decreasing eating out, no skipping meals, meal planning and cooking strategies, keeping healthy foods in the home and planning for success.  [name] has agreed to follow-up with our clinic in 2 weeks. He was informed of the importance of frequent follow-up visits to maximize his success with intensive lifestyle modifications for his multiple health conditions.   Objective:   Blood pressure 122/75, pulse 83, temperature 98.4 F (36.9 C), height 5\' 5"  (1.651 m), weight 260 lb (117.9 kg), SpO2 96 %. Body mass index is 43.27 kg/m.  General: Cooperative, alert, well developed, in no acute distress. HEENT: Conjunctivae and lids unremarkable. Cardiovascular: Regular rhythm.  Lungs: Normal work of breathing. Neurologic: No focal deficits.   Lab Results  Component Value Date   CREATININE 1.01 07/23/2019   BUN 16 07/23/2019   NA 143 07/23/2019   K 4.2 07/23/2019   CL 102 07/23/2019   CO2 24 07/23/2019   Lab Results  Component Value Date   ALT 97 (H) 07/23/2019   AST 42 (H) 07/23/2019   ALKPHOS 130 (H) 07/23/2019   BILITOT 0.4 07/23/2019   Lab Results  Component Value Date   HGBA1C 5.8 (H) 03/22/2019   HGBA1C 5.6 12/21/2017   HGBA1C 5.3 08/16/2016   Lab Results  Component Value Date   INSULIN 45.0 (H) 07/23/2019   Lab Results  Component Value Date   TSH 2.380 03/22/2019   Lab Results  Component Value Date   CHOL 227 (H) 03/27/2019   HDL 43 03/27/2019   LDLCALC 150 (H) 03/27/2019   TRIG 186 (H) 03/27/2019   CHOLHDL 5.3 (H) 03/27/2019   Lab Results  Component Value Date   WBC 7.4 03/22/2019   HGB 17.6 03/22/2019   HCT 50.2 03/22/2019   MCV 87 03/22/2019   PLT 234 03/22/2019   Lab Results  Component Value Date   IRON 96 05/08/2019   FERRITIN 257.4 05/08/2019   Attestation Statements:   Reviewed by  clinician on day of visit: allergies, medications, problem list, medical history, surgical history, family history, social history, and previous encounter notes.  Migdalia Dk, am acting as Location manager for [employer], DO   I have reviewed the above documentation for accuracy and completeness, and I agree with the above. Jearld Lesch, DO

## 2019-08-07 ENCOUNTER — Encounter (INDEPENDENT_AMBULATORY_CARE_PROVIDER_SITE_OTHER): Payer: Self-pay | Admitting: Bariatrics

## 2019-08-09 ENCOUNTER — Other Ambulatory Visit: Payer: Self-pay

## 2019-08-09 ENCOUNTER — Ambulatory Visit (INDEPENDENT_AMBULATORY_CARE_PROVIDER_SITE_OTHER): Payer: BC Managed Care – PPO | Admitting: Psychology

## 2019-08-09 DIAGNOSIS — F3289 Other specified depressive episodes: Secondary | ICD-10-CM | POA: Diagnosis not present

## 2019-08-14 NOTE — Progress Notes (Signed)
Office: [phone]  /  Fax: [phone]    Date: August 28, 2019   Appointment Start Time: 10:01am Duration: 29 minutes Provider: Lawerance Cruel, Psy.D. Type of Session: Individual Therapy  Location of Patient: Home Location of Provider: Provider's Home Type of Contact: Telepsychological Visit via MyChart Video Visit  Session Content: Malik Jackson is a 29 y.o. male presenting via MyChart Video Visit for a follow-up appointment to address the previously established treatment goal of decreasing emotional eating. Today's appointment was a telepsychological visit due to COVID-19. Malik Jackson provided verbal consent for today's telepsychological appointment and he is aware he is responsible for securing confidentiality on his end of the session. Prior to proceeding with today's appointment, Malik Jackson physical location at the time of this appointment was obtained as well a phone number he could be reached at in the event of technical difficulties. Malik Jackson and this provider participated in today's telepsychological service.   Malik Jackson acknowledged understanding that for today's appointment and future telepsychological appointments, MyChart will be utilized. He also verbally acknowledged understanding that the information outlined in the telepsychological informed consent form signed at the onset of treatment would still be applicable despite the change in the videoconferencing platform.   This provider conducted a brief check-in. Malik Jackson shared about recent events, including vacation and weight loss. He discussed making better choices and engaging in portion control while on vacation. Malik Jackson acknowledged craving fast food since returning, but described engaging in self-talk to help cope. Triggers for emotional eating were reviewed. Psychoeducation regarding pleasurable activities, including its impact on emotional eating and overall well-being was provided. Malik Jackson was provided with a handout with various options of  pleasurable activities, and was encouraged to engage in one activity a day and additional activities as needed when triggered to emotionally eat. Malik Jackson. Malik Jackson provided verbal consent during today's appointment for this provider to send a handout about pleasurable activities via e-mail. Malik Jackson was receptive to today's appointment as evidenced by openness to sharing, responsiveness to feedback, and willingness to engage in pleasurable activities to assist with coping.  Mental Status Examination:  Appearance: well groomed and appropriate hygiene  Behavior: appropriate to circumstances Mood: euthymic Affect: mood congruent Speech: normal in rate, volume, and tone Eye Contact: appropriate Psychomotor Activity: appropriate Gait: unable to assess Thought Process: linear, logical, and goal directed  Thought Content/Perception: no hallucinations, delusions, bizarre thinking or behavior reported or observed and no evidence of suicidal and homicidal ideation, plan, and intent Orientation: time, person, place and purpose of appointment Memory/Concentration: memory, attention, language, and fund of knowledge intact  Insight/Judgment: good  Interventions:  Conducted a brief chart review Provided empathic reflections and validation Reviewed content from the previous session Employed supportive psychotherapy interventions to facilitate reduced distress and to improve coping skills with identified stressors Employed motivational interviewing skills to assess patient's willingness/desire to adhere to recommended medical treatments and assignments Psychoeducation provided regarding pleasurable activities  DSM-5 Diagnosis(es): 311 (F32.8) Other Specified Depressive Disorder, Emotional Eating Behaviors  Treatment Goal & Progress: During the initial appointment with this provider, the following treatment goal was established: decrease emotional eating. Malik Jackson has demonstrated progress in his goal as  evidenced by increased awareness of hunger patterns, increased awareness of triggers for emotional eating and reduction in emotional eating. Malik Jackson also demonstrates willingness to engage in pleasurable activities.  Plan: Based on recent progress, the next appointment will be scheduled in three weeks, which will be via MyChart Video Visit. The next session will focus on working towards the established treatment goal.

## 2019-08-21 ENCOUNTER — Encounter (INDEPENDENT_AMBULATORY_CARE_PROVIDER_SITE_OTHER): Payer: Self-pay | Admitting: Bariatrics

## 2019-08-21 ENCOUNTER — Ambulatory Visit (INDEPENDENT_AMBULATORY_CARE_PROVIDER_SITE_OTHER): Payer: BC Managed Care – PPO | Admitting: Bariatrics

## 2019-08-21 ENCOUNTER — Other Ambulatory Visit: Payer: Self-pay

## 2019-08-21 VITALS — BP 149/84 | HR 82 | Temp 97.8°F | Ht 65.0 in | Wt 256.0 lb

## 2019-08-21 DIAGNOSIS — Z6841 Body Mass Index (BMI) 40.0 and over, adult: Secondary | ICD-10-CM

## 2019-08-21 DIAGNOSIS — I10 Essential (primary) hypertension: Secondary | ICD-10-CM | POA: Diagnosis not present

## 2019-08-21 DIAGNOSIS — R7989 Other specified abnormal findings of blood chemistry: Secondary | ICD-10-CM

## 2019-08-21 DIAGNOSIS — E559 Vitamin D deficiency, unspecified: Secondary | ICD-10-CM

## 2019-08-21 NOTE — Progress Notes (Signed)
Chief Complaint:   OBESITY Malik Jackson is here to discuss his progress with his obesity treatment plan along with follow-up of his obesity related diagnoses. Malik Jackson is keeping a food journal and adhering to recommended goals of 1800 calories and 90-115 grams of protein and states he is following his eating plan approximately 50% of the time. Gaudencio states he is walking 15-20 minutes 5 times per week.  Today's visit was #: 3 Starting weight: 263 lbs Starting date: 07/23/2019 Today's weight: 256 lbs Today's date: 08/21/2019 Total lbs lost to date: 7 Total lbs lost since last in-office visit: 4  Interim History: Malik Jackson recently traveled to [place] and was able to make mindful food choices and more appropriate portion meal sizes.  Subjective:   Elevated LFTs history. [name] is not on a statin. He denies right upper quadrant pain.  Vitamin D deficiency. Energy level is stable. Last Vitamin D 13.1 on 07/23/2019.  Essential hypertension. Blood pressure is elevated on today's office visit. He is currently on olmesartan/HCTZ 20/12.5 mg daily and tolerating it well. Ambulatory readings - systolic blood pressure 130's; diastolic blood pressure 80-90's.  BP Readings from Last 3 Encounters:  08/21/19 (!) 149/84  08/06/19 122/75  07/23/19 114/79   Lab Results  Component Value Date   CREATININE 1.01 07/23/2019   CREATININE 1.05 03/27/2019   CREATININE 0.96 03/22/2019   Assessment/Plan:   Elevated LFTs history. We will recheck LFT's at his next office visit.  Vitamin D deficiency. Low Vitamin D level contributes to fatigue and are associated with obesity, breast, and colon cancer. He agrees to continue to take Vitamin D and will follow-up for routine testing of Vitamin D in three months.   Essential hypertension. Blade is working on healthy weight loss and exercise to improve blood pressure control. We will watch for signs of hypotension as he continues his lifestyle  modifications. Will monitor. He will continue his current anti-hypertensive therapy and will continue journaling.  Class 3 severe obesity with serious comorbidity and body mass index (BMI) of 40.0 to 44.9 in adult, unspecified obesity type (HCC).  [name] is currently in the action stage of change. As such, his goal is to continue with weight loss efforts. He has agreed to keeping a food journal and adhering to recommended goals of 1800 calories and 90-115 grams of protein daily.   Exercise goals: [name] will continue his current exercise regimen.  Behavioral modification strategies: no skipping meals, better snacking choices, travel eating strategies and planning for success.  Malik Jackson has agreed to follow-up with our clinic in 2 weeks. He was informed of the importance of frequent follow-up visits to maximize his success with intensive lifestyle modifications for his multiple health conditions.   Objective:   Blood pressure (!) 149/84, pulse 82, temperature 97.8 F (36.6 C), height 5\' 5"  (1.651 m), weight 256 lb (116.1 kg), SpO2 97 %. Body mass index is 42.6 kg/m.  General: Cooperative, alert, well developed, in no acute distress. HEENT: Conjunctivae and lids unremarkable. Cardiovascular: Regular rhythm.  Lungs: Normal work of breathing. Neurologic: No focal deficits.   Lab Results  Component Value Date   CREATININE 1.01 07/23/2019   BUN 16 07/23/2019   NA 143 07/23/2019   K 4.2 07/23/2019   CL 102 07/23/2019   CO2 24 07/23/2019   Lab Results  Component Value Date   ALT 97 (H) 07/23/2019   AST 42 (H) 07/23/2019   ALKPHOS 130 (H) 07/23/2019   BILITOT 0.4 07/23/2019  Lab Results  Component Value Date   HGBA1C 5.8 (H) 03/22/2019   HGBA1C 5.6 12/21/2017   HGBA1C 5.3 08/16/2016   Lab Results  Component Value Date   INSULIN 45.0 (H) 07/23/2019   Lab Results  Component Value Date   TSH 2.380 03/22/2019   Lab Results  Component Value Date   CHOL 227 (H) 03/27/2019    HDL 43 03/27/2019   LDLCALC 150 (H) 03/27/2019   TRIG 186 (H) 03/27/2019   CHOLHDL 5.3 (H) 03/27/2019   Lab Results  Component Value Date   WBC 7.4 03/22/2019   HGB 17.6 03/22/2019   HCT 50.2 03/22/2019   MCV 87 03/22/2019   PLT 234 03/22/2019   Lab Results  Component Value Date   IRON 96 05/08/2019   FERRITIN 257.4 05/08/2019   Attestation Statements:   Reviewed by clinician on day of visit: allergies, medications, problem list, medical history, surgical history, family history, social history, and previous encounter notes.  Time spent on visit including pre-visit chart review and post-visit charting and care was 20 minutes.   Migdalia Dk, am acting as Location manager for [employer], DO   I have reviewed the above documentation for accuracy and completeness, and I agree with the above. Jearld Lesch, DO

## 2019-08-26 ENCOUNTER — Other Ambulatory Visit (INDEPENDENT_AMBULATORY_CARE_PROVIDER_SITE_OTHER): Payer: Self-pay | Admitting: Medical

## 2019-08-27 NOTE — Telephone Encounter (Signed)
He will need to consult with Dr. Manson Passey. Looks like they started him on this at weight management.

## 2019-08-28 ENCOUNTER — Telehealth (INDEPENDENT_AMBULATORY_CARE_PROVIDER_SITE_OTHER): Payer: BC Managed Care – PPO | Admitting: Psychology

## 2019-08-28 DIAGNOSIS — F3289 Other specified depressive episodes: Secondary | ICD-10-CM | POA: Diagnosis not present

## 2019-08-29 NOTE — Telephone Encounter (Signed)
Pt was advised to contact Dr. Manson Passey for refill on med

## 2019-09-04 ENCOUNTER — Ambulatory Visit (INDEPENDENT_AMBULATORY_CARE_PROVIDER_SITE_OTHER): Payer: BC Managed Care – PPO | Admitting: Bariatrics

## 2019-09-04 ENCOUNTER — Encounter (INDEPENDENT_AMBULATORY_CARE_PROVIDER_SITE_OTHER): Payer: Self-pay | Admitting: Bariatrics

## 2019-09-04 ENCOUNTER — Other Ambulatory Visit: Payer: Self-pay

## 2019-09-04 VITALS — BP 132/81 | HR 82 | Temp 97.8°F | Ht 65.0 in | Wt 251.0 lb

## 2019-09-04 DIAGNOSIS — E559 Vitamin D deficiency, unspecified: Secondary | ICD-10-CM

## 2019-09-04 DIAGNOSIS — I10 Essential (primary) hypertension: Secondary | ICD-10-CM | POA: Diagnosis not present

## 2019-09-04 DIAGNOSIS — Z9189 Other specified personal risk factors, not elsewhere classified: Secondary | ICD-10-CM | POA: Diagnosis not present

## 2019-09-04 DIAGNOSIS — Z6841 Body Mass Index (BMI) 40.0 and over, adult: Secondary | ICD-10-CM

## 2019-09-04 MED ORDER — VITAMIN D (ERGOCALCIFEROL) 1.25 MG (50000 UNIT) PO CAPS: 50000.0000 [IU] | ORAL_CAPSULE | ORAL | 0 refills | Status: DC

## 2019-09-04 NOTE — Progress Notes (Signed)
  Office: [phone]  /  Fax: [phone]    Date: Sep 18, 2019   Appointment Start Time: 10:30am Duration: 29 minutes Provider: Lawerance Cruel, Psy.D. Type of Session: Individual Therapy  Location of Patient: Home Location of Provider: Provider's Home Type of Contact: Telepsychological Visit via MyChart Video Visit  Session Content: Malik Jackson is a 29 y.o. male presenting via MyChart Video Visit for a follow-up appointment to address the previously established treatment goal of decreasing emotional eating. Today's appointment was a telepsychological visit due to COVID-19. Malik Jackson provided verbal consent for today's telepsychological appointment and he is aware he is responsible for securing confidentiality on his end of the session. Prior to proceeding with today's appointment, Malik Jackson's physical location at the time of this appointment was obtained as well a phone number he could be reached at in the event of technical difficulties. Malik Jackson and this provider participated in today's telepsychological service.   This provider conducted a brief check-in. Malik Jackson reported work has been busy. He indicated work provides a routine with his eating habits, adding it is challenging on his days off. Nevertheless, he described doing well with his meal plan. Session focused on Malik Jackson's initial weight goal he had in mind when he started with the clinic. He noted as he continues with the clinic, he feels his goal was unrealistic. This was explored and processed.This provider sent Malik Jackson a MyChart message with a handout for pleasurable activities as he indicated he was unable to access the e-mail sent after the last appointment. Prior to sending the message, this provider explained the message would be visible to all providers, as it would be part of the electronic medical record. Malik Jackson verbally acknowledged understanding, and verbally consented to this provider sending the MyChart message. Furthermore, termination  planning was discussed. Malik Jackson was receptive to a follow-up appointment in 3-4 weeks and an additional follow-up/termination appointment in 3-4 weeks after that.  Malik Jackson was receptive to today's appointment as evidenced by openness to sharing and responsiveness to feedback. He was receptive to focusing on pleasurable activities.   Mental Status Examination:  Appearance: well groomed and appropriate hygiene  Behavior: appropriate to circumstances Mood: euthymic Affect: mood congruent Speech: normal in rate, volume, and tone Eye Contact: appropriate Psychomotor Activity: appropriate Gait: unable to assess Thought Process: linear, logical, and goal directed  Thought Content/Perception: no hallucinations, delusions, bizarre thinking or behavior reported or observed and no evidence of suicidal and homicidal ideation, plan, and intent Orientation: time, person, place and purpose of appointment Memory/Concentration: memory, attention, language, and fund of knowledge intact  Insight/Judgment: good  Interventions:  Conducted a brief chart review Provided empathic reflections and validation Reviewed content from the previous session Employed supportive psychotherapy interventions to facilitate reduced distress and to improve coping skills with identified stressors Employed motivational interviewing skills to assess patient's willingness/desire to adhere to recommended medical treatments and assignments Discussed termination planning  DSM-5 Diagnosis(es): 311 (F32.8) Other Specified Depressive Disorder, Emotional Eating Behaviors  Treatment Goal & Progress: During the initial appointment with this provider, the following treatment goal was established: decrease emotional eating. Malik Jackson has demonstrated progress in his goal as evidenced by increased awareness of hunger patterns, increased awareness of triggers for emotional eating and reduction in emotional eating. Malik Jackson also continues to demonstrate  willingness to engage in learned skill(s).  Plan: The next appointment will be scheduled in approximately three weeks, which will be via MyChart Video Visit. The next session will focus on working towards the established treatment goal.

## 2019-09-05 NOTE — Progress Notes (Signed)
Chief Complaint:   OBESITY Malik Jackson is here to discuss his progress with his obesity treatment plan along with follow-up of his obesity related diagnoses. Malik Jackson is keeping a food journal and adhering to recommended goals of 1800 calories and 90-115 grams of protein and states he is following his eating plan approximately 90% of the time. Malik Jackson states he is walking 15,000 steps 5-7 times per week.  Today's visit was #: 4 Starting weight: 263 lbs Starting date: 07/23/2019 Today's weight: 251 lbs Today's date: 09/04/2019 Total lbs lost to date: 12 Total lbs lost since last in-office visit: 5  Interim History: Malik Jackson is down 5 lbs. He states that he has been mindful of his eating.  Subjective:   Vitamin D deficiency. No nausea, vomiting, or muscle weakness. Last Vitamin D 13.1 on 07/23/2019.  Essential hypertension. Malik Jackson is taking Benicar.  BP Readings from Last 3 Encounters:  09/04/19 132/81  08/21/19 (!) 149/84  08/06/19 122/75   Lab Results  Component Value Date   CREATININE 1.01 07/23/2019   CREATININE 1.05 03/27/2019   CREATININE 0.96 03/22/2019   At risk for osteoporosis. Malik Jackson is at higher risk of osteopenia and osteoporosis due to Vitamin D deficiency.   Assessment/Plan:   Vitamin D deficiency. Low Vitamin D level contributes to fatigue and are associated with obesity, breast, and colon cancer. He was given a prescription for Vitamin D, Ergocalciferol, (DRISDOL) 1.25 MG (50000 UNIT) CAPS capsule every week #4 with 0 refills and will follow-up for routine testing of Vitamin D, at least 2-3 times per year to avoid over-replacement.     Essential hypertension. Malik Jackson is working on healthy weight loss and exercise to improve blood pressure control. We will watch for signs of hypotension as he continues his lifestyle modifications. He will continue his medication as directed.  At risk for osteoporosis. Malik Jackson was given approximately 15 minutes of osteoporosis  prevention counseling today. Malik Jackson is at risk for osteopenia and osteoporosis due to his Vitamin D deficiency. He was encouraged to take his Vitamin D and follow his higher calcium diet and increase strengthening exercise to help strengthen his bones and decrease his risk of osteopenia and osteoporosis.  Repetitive spaced learning was employed today to elicit superior memory formation and behavioral change.  Class 3 severe obesity with serious comorbidity and body mass index (BMI) of 40.0 to 44.9 in adult, unspecified obesity type (Malik Jackson).  Malik Jackson is currently in the action stage of change. As such, his goal is to continue with weight loss efforts. He has agreed to keeping a food journal and adhering to recommended goals of 1800 calories and 90-115 grams of protein.   He will work on meal planning, intentional eating, minimizing eating out, and will continue to read labels.  Exercise goals: Malik Jackson will continue his walking and increase over time.  Behavioral modification strategies: increasing lean protein intake, decreasing simple carbohydrates, increasing vegetables, increasing water intake, decreasing eating out, no skipping meals, meal planning and cooking strategies, keeping healthy foods in the home and planning for success.  Malik Jackson has agreed to follow-up with our clinic in 2-3 weeks. He was informed of the importance of frequent follow-up visits to maximize his success with intensive lifestyle modifications for his multiple health conditions.   Objective:   Blood pressure 132/81, pulse 82, temperature 97.8 F (36.6 C), height 5\' 5"  (1.651 m), weight 251 lb (113.9 kg), SpO2 95 %. Body mass index is 41.77 kg/m.  General: Cooperative, alert, well developed, in  no acute distress. HEENT: Conjunctivae and lids unremarkable. Cardiovascular: Regular rhythm.  Lungs: Normal work of breathing. Neurologic: No focal deficits.   Lab Results  Component Value Date   CREATININE 1.01 07/23/2019     BUN 16 07/23/2019   NA 143 07/23/2019   K 4.2 07/23/2019   CL 102 07/23/2019   CO2 24 07/23/2019   Lab Results  Component Value Date   ALT 97 (H) 07/23/2019   AST 42 (H) 07/23/2019   ALKPHOS 130 (H) 07/23/2019   BILITOT 0.4 07/23/2019   Lab Results  Component Value Date   HGBA1C 5.8 (H) 03/22/2019   HGBA1C 5.6 12/21/2017   HGBA1C 5.3 08/16/2016   Lab Results  Component Value Date   INSULIN 45.0 (H) 07/23/2019   Lab Results  Component Value Date   TSH 2.380 03/22/2019   Lab Results  Component Value Date   CHOL 227 (H) 03/27/2019   HDL 43 03/27/2019   LDLCALC 150 (H) 03/27/2019   TRIG 186 (H) 03/27/2019   CHOLHDL 5.3 (H) 03/27/2019   Lab Results  Component Value Date   WBC 7.4 03/22/2019   HGB 17.6 03/22/2019   HCT 50.2 03/22/2019   MCV 87 03/22/2019   PLT 234 03/22/2019   Lab Results  Component Value Date   IRON 96 05/08/2019   FERRITIN 257.4 05/08/2019   Attestation Statements:   Reviewed by clinician on day of visit: allergies, medications, problem list, medical history, surgical history, family history, social history, and previous encounter notes.  Fernanda Drum, am acting as Energy manager for [employer], DO   I have reviewed the above documentation for accuracy and completeness, and I agree with the above. Corinna Capra, DO

## 2019-09-06 ENCOUNTER — Encounter (INDEPENDENT_AMBULATORY_CARE_PROVIDER_SITE_OTHER): Payer: Self-pay | Admitting: Bariatrics

## 2019-09-18 ENCOUNTER — Encounter (INDEPENDENT_AMBULATORY_CARE_PROVIDER_SITE_OTHER): Payer: Self-pay

## 2019-09-18 ENCOUNTER — Other Ambulatory Visit: Payer: Self-pay

## 2019-09-18 ENCOUNTER — Telehealth (INDEPENDENT_AMBULATORY_CARE_PROVIDER_SITE_OTHER): Payer: BC Managed Care – PPO | Admitting: Psychology

## 2019-09-18 DIAGNOSIS — F3289 Other specified depressive episodes: Secondary | ICD-10-CM

## 2019-09-20 NOTE — Progress Notes (Signed)
Office: [phone]  /  Fax: [phone]    Date: Oct 04, 2019   Appointment Start Time: 10:01am Duration: 24 minutes Provider: Lawerance Cruel, Psy.D. Type of Session: Individual Therapy  Location of Patient: Home Location of Provider: Provider's Home Type of Contact: Telepsychological Visit via MyChart Video Visit  Session Content: Malik Jackson is a 29 y.o. male presenting via MyChart Video Visit for a follow-up appointment to address the previously established treatment goal of decreasing emotional eating. Today's appointment was a telepsychological visit due to COVID-19. Malik Jackson provided verbal consent for today's telepsychological appointment and he is aware he is responsible for securing confidentiality on his end of the session. Prior to proceeding with today's appointment, Malik Jackson's physical location at the time of this appointment was obtained as well a phone number he could be reached at in the event of technical difficulties. Malik Jackson and this provider participated in today's telepsychological service.   This provider conducted a brief check-in. Malik Jackson shared he is doing well eating, but noted ongoing stressors. Additionally, [facility] discussed the previous impact of his parents' relationship on his well-being, including eating habits. Associated thoughts and feelings were processed. Psychoeducation regarding thought defusion, including its impact on emotional eating and overall well-being was provided. [name] was led through a thought defusion exercise, and a handout with various exercises was provided. [name] was encouraged to engage in the thought defusion exercises between now and the next appointment with this provider. Malik Jackson agreed. For the thought defusion exercises during today's appointment, Malik Jackson utilized the following thought: "I am struggling." His experience was processed and he was observed laughing. Malik Jackson provided verbal consent during today's appointment for this provider to send a  handout with thought defusion exercises via e-mail. Malik Jackson was receptive to today's appointment as evidenced by openness to sharing, responsiveness to feedback, and willingness to engage in thought defusion exercises to assist with coping.  Mental Status Examination:  Appearance: well groomed and appropriate hygiene  Behavior: appropriate to circumstances Mood: euthymic Affect: mood congruent Speech: normal in rate, volume, and tone Eye Contact: appropriate Psychomotor Activity: appropriate Gait: unable to assess Thought Process: linear, logical, and goal directed  Thought Content/Perception: no hallucinations, delusions, bizarre thinking or behavior reported or observed and no evidence of suicidal and homicidal ideation, plan, and intent Orientation: time, person, place and purpose of appointment Memory/Concentration: memory, attention, language, and fund of knowledge intact  Insight/Judgment: good  Interventions:  Conducted a brief chart review Provided empathic reflections and validation Employed supportive psychotherapy interventions to facilitate reduced distress and to improve coping skills with identified stressors Employed motivational interviewing skills to assess patient's willingness/desire to adhere to recommended medical treatments and assignments Psychoeducation provided regarding thought defusion Engaged patient in thought defusion exercise(s) Employed acceptance and commitment interventions to emphasize mindfulness and acceptance without struggle  DSM-5 Diagnosis(es): 311 (F32.8) Other Specified Depressive Disorder, Emotional Eating Behaviors  Treatment Goal & Progress: During the initial appointment with this provider, the following treatment goal was established: decrease emotional eating. Malik Jackson demonstrated progress in his goal as evidenced by increased awareness of hunger patterns, increased awareness of triggers for emotional eating and reduction in emotional eating.  Malik Jackson also continues to demonstrate willingness to engage in learned skill(s).  Plan: Malik Jackson declined future appointments with this provider, noting, "I feel pretty good" and discussed an increased ability to cope. He acknowledged understanding that he may request a follow-up appointment with this provider in the future as long as he is still established with the clinic. No further follow-up planned  by this provider.

## 2019-09-25 ENCOUNTER — Encounter (INDEPENDENT_AMBULATORY_CARE_PROVIDER_SITE_OTHER): Payer: Self-pay | Admitting: Bariatrics

## 2019-09-25 ENCOUNTER — Ambulatory Visit (INDEPENDENT_AMBULATORY_CARE_PROVIDER_SITE_OTHER): Payer: BC Managed Care – PPO | Admitting: Bariatrics

## 2019-09-25 ENCOUNTER — Other Ambulatory Visit: Payer: Self-pay

## 2019-09-25 VITALS — BP 123/78 | HR 93 | Temp 98.0°F | Ht 65.0 in | Wt 254.0 lb

## 2019-09-25 DIAGNOSIS — E559 Vitamin D deficiency, unspecified: Secondary | ICD-10-CM | POA: Diagnosis not present

## 2019-09-25 DIAGNOSIS — R748 Abnormal levels of other serum enzymes: Secondary | ICD-10-CM | POA: Diagnosis not present

## 2019-09-25 DIAGNOSIS — Z9189 Other specified personal risk factors, not elsewhere classified: Secondary | ICD-10-CM | POA: Diagnosis not present

## 2019-09-25 DIAGNOSIS — Z6841 Body Mass Index (BMI) 40.0 and over, adult: Secondary | ICD-10-CM

## 2019-09-25 DIAGNOSIS — I10 Essential (primary) hypertension: Secondary | ICD-10-CM

## 2019-09-25 MED ORDER — VITAMIN D (ERGOCALCIFEROL) 1.25 MG (50000 UNIT) PO CAPS: 50000.0000 [IU] | ORAL_CAPSULE | ORAL | 0 refills | Status: DC

## 2019-09-25 NOTE — Progress Notes (Signed)
Chief Complaint:   OBESITY Malik Jackson is here to discuss his progress with his obesity treatment plan along with follow-up of his obesity related diagnoses. Malik Jackson is keeping a food journal and adhering to recommended goals of 1800 calories and 90-115 grams of protein and states he is following his eating plan approximately 75% of the time. Malik Jackson states he is walking 30 minutes 1 time per week.  Today's visit was #: 5 Starting weight: 263 lbs Starting date: 07/23/2019 Today's weight: 254 lbs Today's date: 09/25/2019 Total lbs lost to date: 9 Total lbs lost since last in-office visit: 0  Interim History: Malik Jackson is up 3 lbs. He is doing better with portion control and reports doing well with his water intake.  Subjective:   Vitamin D deficiency. No nausea, vomiting, or muscle weakness. Last Vitamin D 13.1 on 07/23/2019.  Essential hypertension. Malik Jackson is taking Benicar HCT. Blood pressure is well controlled.  BP Readings from Last 3 Encounters:  09/25/19 123/78  09/04/19 132/81  08/21/19 (!) 149/84   Lab Results  Component Value Date   CREATININE 1.01 07/23/2019   CREATININE 1.05 03/27/2019   CREATININE 0.96 03/22/2019   Elevated liver enzymes. Malik Jackson has a new dx of elevated ALT. His BMI is over 40. He denies abdominal pain or jaundice and has never been told of any liver problems in the past. He reports decreased alcohol intake. No abdominal pain.  Lab Results  Component Value Date   ALT 97 (H) 07/23/2019   AST 42 (H) 07/23/2019   ALKPHOS 130 (H) 07/23/2019   BILITOT 0.4 07/23/2019   At risk for impaired function of liver. Malik Jackson is at risk for impaired function of liver due to likely diagnosis of fatty liver as evidenced by recent elevated liver enzymes.   Assessment/Plan:   Vitamin D deficiency. Low Vitamin D level contributes to fatigue and are associated with obesity, breast, and colon cancer. He was given a prescription for Vitamin D, Ergocalciferol,  (DRISDOL) 1.25 MG (50000 UNIT) CAPS capsule every week #4 with 0 refills and will follow-up for routine testing of Vitamin D, at least 2-3 times per year to avoid over-replacement.   Essential hypertension. Malik Jackson is working on healthy weight loss and exercise to improve blood pressure control. We will watch for signs of hypotension as he continues his lifestyle modifications. He will continue his medication as directed.  Elevated liver enzymes. We discussed the likely diagnosis of non-alcoholic fatty liver disease today and how this condition is obesity related. Malik Jackson was educated the importance of weight loss. Malik Jackson agreed to continue with his weight loss efforts with healthier diet and exercise as an essential part of his treatment plan. Will check liver enzymes today. He will continue minimizing Tylenol and alcohol use.  At risk for impaired function of liver. We discussed the likely diagnosis of non-alcoholic fatty liver disease today and how this condition is obesity related. Malik Jackson was educated the importance of weight loss. Malik Jackson agreed to continue with his weight loss efforts with healthier diet and exercise as an essential part of his treatment plan.  Class 3 severe obesity with serious comorbidity and body mass index (BMI) of 40.0 to 44.9 in adult, unspecified obesity type (Malik Jackson).  Malik Jackson is currently in the action stage of change. As such, his goal is to continue with weight loss efforts. He has agreed to keeping a food journal and adhering to recommended goals of 1800 calories and 90-115 grams of protein.   He will  work on meal planning and intentional eating.  Exercise goals: Malik Jackson will increase his walking for exercise.  Behavioral modification strategies: increasing lean protein intake, decreasing simple carbohydrates, increasing vegetables, increasing water intake, decreasing eating out, no skipping meals, meal planning and cooking strategies, keeping healthy foods in the home and  planning for success.  Malik Jackson has agreed to follow-up with our clinic in 2 weeks. He was informed of the importance of frequent follow-up visits to maximize his success with intensive lifestyle modifications for his multiple health conditions.   Malik Jackson was informed we would discuss his lab results at his next visit unless there is a critical issue that needs to be addressed sooner. Malik Jackson agreed to keep his next visit at the agreed upon time to discuss these results.  Objective:   Blood pressure 123/78, pulse 93, temperature 98 F (36.7 C), height 5\' 5"  (1.651 m), weight 254 lb (115.2 kg), SpO2 96 %. Body mass index is 42.27 kg/m.  General: Cooperative, alert, well developed, in no acute distress. HEENT: Conjunctivae and lids unremarkable. Cardiovascular: Regular rhythm.  Lungs: Normal work of breathing. Neurologic: No focal deficits.   Lab Results  Component Value Date   CREATININE 1.01 07/23/2019   BUN 16 07/23/2019   NA 143 07/23/2019   K 4.2 07/23/2019   CL 102 07/23/2019   CO2 24 07/23/2019   Lab Results  Component Value Date   ALT 97 (H) 07/23/2019   AST 42 (H) 07/23/2019   ALKPHOS 130 (H) 07/23/2019   BILITOT 0.4 07/23/2019   Lab Results  Component Value Date   HGBA1C 5.8 (H) 03/22/2019   HGBA1C 5.6 12/21/2017   HGBA1C 5.3 08/16/2016   Lab Results  Component Value Date   INSULIN 45.0 (H) 07/23/2019   Lab Results  Component Value Date   TSH 2.380 03/22/2019   Lab Results  Component Value Date   CHOL 227 (H) 03/27/2019   HDL 43 03/27/2019   LDLCALC 150 (H) 03/27/2019   TRIG 186 (H) 03/27/2019   CHOLHDL 5.3 (H) 03/27/2019   Lab Results  Component Value Date   WBC 7.4 03/22/2019   HGB 17.6 03/22/2019   HCT 50.2 03/22/2019   MCV 87 03/22/2019   PLT 234 03/22/2019   Lab Results  Component Value Date   IRON 96 05/08/2019   FERRITIN 257.4 05/08/2019   Attestation Statements:   Reviewed by clinician on day of visit: allergies, medications, problem  list, medical history, surgical history, family history, social history, and previous encounter notes.  05/10/2019, am acting as Malik Jackson for Energy manager, DO   I have reviewed the above documentation for accuracy and completeness, and I agree with the above. [employer], DO

## 2019-09-25 NOTE — Addendum Note (Signed)
Addended by: Len Blalock on: 09/25/2019 03:46 PM   Modules accepted: Orders

## 2019-09-26 ENCOUNTER — Other Ambulatory Visit: Payer: Self-pay | Admitting: Medical

## 2019-09-26 LAB — COMPREHENSIVE METABOLIC PANEL
ALT: 47 IU/L — ABNORMAL HIGH (ref 0–44)
AST: 23 IU/L (ref 0–40)
Albumin/Globulin Ratio: 1.6 (ref 1.2–2.2)
Albumin: 4.6 g/dL (ref 4.0–5.0)
Alkaline Phosphatase: 112 IU/L (ref 48–121)
BUN/Creatinine Ratio: 18 (ref 9–20)
BUN: 17 mg/dL (ref 6–20)
Bilirubin Total: <0.2 mg/dL (ref 0.0–1.2)
CO2: 22 mmol/L (ref 20–29)
Calcium: 9.4 mg/dL (ref 8.7–10.2)
Chloride: 102 mmol/L (ref 96–106)
Creatinine, Ser: 0.96 mg/dL (ref 0.76–1.27)
GFR calc Af Amer: 121 mL/min/{1.73_m2} (ref >59)
GFR calc non Af Amer: 105 mL/min/{1.73_m2} (ref >59)
Globulin, Total: 2.9 g/dL (ref 1.5–4.5)
Glucose: 104 mg/dL — ABNORMAL HIGH (ref 65–99)
Potassium: 4.2 mmol/L (ref 3.5–5.2)
Sodium: 138 mmol/L (ref 134–144)
Total Protein: 7.5 g/dL (ref 6.0–8.5)

## 2019-09-26 LAB — VITAMIN D 25 HYDROXY (VIT D DEFICIENCY, FRACTURES): Vit D, 25-Hydroxy: 26.3 ng/mL — ABNORMAL LOW (ref 30.0–100.0)

## 2019-09-26 NOTE — Progress Notes (Signed)
Left msg for pt  to c/b re: labs

## 2019-09-27 NOTE — Progress Notes (Signed)
Left msg for pt.

## 2019-09-27 NOTE — Progress Notes (Signed)
Pt advised.

## 2019-10-03 ENCOUNTER — Encounter (INDEPENDENT_AMBULATORY_CARE_PROVIDER_SITE_OTHER): Payer: Self-pay

## 2019-10-04 ENCOUNTER — Other Ambulatory Visit: Payer: Self-pay

## 2019-10-04 ENCOUNTER — Telehealth (INDEPENDENT_AMBULATORY_CARE_PROVIDER_SITE_OTHER): Payer: BC Managed Care – PPO | Admitting: Psychology

## 2019-10-04 DIAGNOSIS — F3289 Other specified depressive episodes: Secondary | ICD-10-CM | POA: Diagnosis not present

## 2019-10-16 ENCOUNTER — Ambulatory Visit (INDEPENDENT_AMBULATORY_CARE_PROVIDER_SITE_OTHER): Payer: BC Managed Care – PPO | Admitting: Adult Health

## 2019-10-16 ENCOUNTER — Other Ambulatory Visit: Payer: Self-pay

## 2019-10-16 ENCOUNTER — Encounter (INDEPENDENT_AMBULATORY_CARE_PROVIDER_SITE_OTHER): Payer: Self-pay | Admitting: Adult Health

## 2019-10-16 VITALS — BP 160/104 | HR 83 | Temp 97.9°F | Ht 65.0 in | Wt 260.0 lb

## 2019-10-16 DIAGNOSIS — E559 Vitamin D deficiency, unspecified: Secondary | ICD-10-CM | POA: Diagnosis not present

## 2019-10-16 DIAGNOSIS — E7849 Other hyperlipidemia: Secondary | ICD-10-CM | POA: Diagnosis not present

## 2019-10-16 DIAGNOSIS — I10 Essential (primary) hypertension: Secondary | ICD-10-CM

## 2019-10-16 DIAGNOSIS — Z6841 Body Mass Index (BMI) 40.0 and over, adult: Secondary | ICD-10-CM

## 2019-10-16 DIAGNOSIS — Z9189 Other specified personal risk factors, not elsewhere classified: Secondary | ICD-10-CM | POA: Diagnosis not present

## 2019-10-16 MED ORDER — VITAMIN D (ERGOCALCIFEROL) 1.25 MG (50000 UNIT) PO CAPS: 50000.0000 [IU] | ORAL_CAPSULE | ORAL | 0 refills | Status: DC

## 2019-10-16 NOTE — Progress Notes (Signed)
Chief Complaint:   OBESITY Malik Jackson is here to discuss his progress with his obesity treatment plan along with follow-up of his obesity related diagnoses. Malik Jackson is on keeping a food journal and adhering to recommended goals of 1800 calories and 90-115 grams of protein daily and states he is following his eating plan approximately 40% of the time. Malik Jackson states he is doing 0 minutes 0 times per week.  Today's visit was #: 6 Starting weight: 263 lbs Starting date: 07/23/2019 Today's weight: 260 lbs Today's date: 10/16/2019 Total lbs lost to date: 3 Total lbs lost since last in-office visit: 0  Interim History: [place] not follow the plan. When he is on plan he will consume only lunch and dinner due to a demanding night shift work schedule, but he tries to ensure that he mets calorie and protein goals with the two larger meals.    Subjective:   1. Vitamin D deficiency Malik Jackson's Vit D level on 09/25/2019 was 26.3. He is on prescription strength Vit D supplementation, and is tolerating it well.  2. Essential hypertension Malik Jackson's blood pressure is quite elevated at today's office visit. He is on olmesartan-hydrochlorothiazide 20-12.5 mg q daily. He worked 12 hours last night, and has been up for >16 hours and consumed quite a bit of caffeine. His ambulatory blood pressure readings systolic range between 161-096 and diastolic 80. He denies chest pain or dyspnea. He denies tobacco or vape use.  3. Other hyperlipidemia Malik Jackson's lipid panel on 03/27/2019, showed a total cholesterol of 227, triglycerides 186, HDL 43, and LDL 150. He is not on statin therapy. He denies tobacco or vape use.  4. At risk for heart disease Malik Jackson is at a higher than average risk for cardiovascular disease due to obesity and hyperlipidemia.   Assessment/Plan:   1. Vitamin D deficiency Low Vitamin D level contributes to fatigue and are associated with obesity,  breast, and colon cancer. We will refill prescription Vitamin D for 1 month. Malik Jackson will follow-up for routine testing of Vitamin D, at least 2-3 times per year to avoid over-replacement. We will recheck labs in 3 months.  - Vitamin D, Ergocalciferol, (DRISDOL) 1.25 MG (50000 UNIT) CAPS capsule; Take 1 capsule (50,000 Units total) by mouth every 7 (seven) days.  Dispense: 4 capsule; Refill: 0  2. Essential hypertension Malik Jackson is working on healthy weight loss and exercise to improve blood pressure control. We will watch for signs of hypotension as he continues his lifestyle modifications. Malik Jackson will continue his current anti-hypertensive regimen and decrease caffeine. We will continue to monitor his blood pressure closely.  3. Other hyperlipidemia Cardiovascular risk and specific lipid/LDL goals reviewed. We discussed several lifestyle modifications today. Malik Jackson will continue his meal plan, and will continue to work on exercise and weight loss efforts. We will continue to monitor labs. Orders and follow up as documented in patient record.   Counseling Intensive lifestyle modifications are the first line treatment for this issue. . Dietary changes: Increase soluble fiber. Decrease simple carbohydrates. . Exercise changes: Moderate to vigorous-intensity aerobic activity 150 minutes per week if tolerated. . Lipid-lowering medications: see documented in medical record.  4. At risk for heart disease Malik Jackson was given approximately 15 minutes of coronary artery disease prevention counseling today. He is 29 y.o. male and has risk factors for heart disease including obesity and hyperlipidemia. We discussed intensive lifestyle modifications today with an emphasis on specific weight loss  instructions and strategies.   Repetitive spaced learning was employed today to elicit superior memory formation and behavioral change.  5. Class 3 severe obesity with serious comorbidity and body mass index (BMI) of  40.0 to 44.9 in adult, unspecified obesity type (HCC) Malik Jackson is currently in the action stage of change. As such, his goal is to continue with weight loss efforts. He has agreed to keeping a food journal and adhering to recommended goals of 1800 calories and 90-115 grams of protein daily.   Exercise goals: No exercise has been prescribed at this time.  Behavioral modification strategies: increasing lean protein intake, decreasing simple carbohydrates, no skipping meals and meal planning and cooking strategies.  Malik Jackson has agreed to follow-up with our clinic in 2 weeks. He was informed of the importance of frequent follow-up visits to maximize his success with intensive lifestyle modifications for his multiple health conditions.   Objective:   Blood pressure (!) 160/104, pulse 83, temperature 97.9 F (36.6 C), temperature source Oral, height 5\' 5"  (1.651 m), weight 260 lb (117.9 kg), SpO2 98 %. Body mass index is 43.27 kg/m.  General: Cooperative, alert, well developed, in no acute distress. HEENT: Conjunctivae and lids unremarkable. Cardiovascular: Regular rhythm.  Lungs: Normal work of breathing. Neurologic: No focal deficits.   Lab Results  Component Value Date   CREATININE 0.96 09/25/2019   BUN 17 09/25/2019   NA 138 09/25/2019   K 4.2 09/25/2019   CL 102 09/25/2019   CO2 22 09/25/2019   Lab Results  Component Value Date   ALT 47 (H) 09/25/2019   AST 23 09/25/2019   ALKPHOS 112 09/25/2019   BILITOT <0.2 09/25/2019   Lab Results  Component Value Date   HGBA1C 5.8 (H) 03/22/2019   HGBA1C 5.6 12/21/2017   HGBA1C 5.3 08/16/2016   Lab Results  Component Value Date   INSULIN 45.0 (H) 07/23/2019   Lab Results  Component Value Date   TSH 2.380 03/22/2019   Lab Results  Component Value Date   CHOL 227 (H) 03/27/2019   HDL 43 03/27/2019   LDLCALC 150 (H) 03/27/2019   TRIG 186 (H) 03/27/2019   CHOLHDL 5.3 (H) 03/27/2019   Lab Results  Component Value Date   WBC  7.4 03/22/2019   HGB 17.6 03/22/2019   HCT 50.2 03/22/2019   MCV 87 03/22/2019   PLT 234 03/22/2019   Lab Results  Component Value Date   IRON 96 05/08/2019   FERRITIN 257.4 05/08/2019   Attestation Statements:   Reviewed by clinician on day of visit: allergies, medications, problem list, medical history, surgical history, family history, social history, and previous encounter notes.   05/10/2019, am acting as Trude Mcburney for Energy manager, NP-C.  I have reviewed the above documentation for accuracy and completeness, and I agree with the above. -  [employer], NP

## 2019-10-29 ENCOUNTER — Ambulatory Visit (INDEPENDENT_AMBULATORY_CARE_PROVIDER_SITE_OTHER): Payer: BC Managed Care – PPO | Admitting: Bariatrics

## 2019-10-29 ENCOUNTER — Other Ambulatory Visit: Payer: Self-pay

## 2019-10-29 ENCOUNTER — Encounter (INDEPENDENT_AMBULATORY_CARE_PROVIDER_SITE_OTHER): Payer: Self-pay | Admitting: Bariatrics

## 2019-10-29 VITALS — BP 124/79 | HR 82 | Temp 98.2°F | Ht 65.0 in | Wt 259.0 lb

## 2019-10-29 DIAGNOSIS — Z6841 Body Mass Index (BMI) 40.0 and over, adult: Secondary | ICD-10-CM

## 2019-10-29 DIAGNOSIS — E88819 Insulin resistance, unspecified: Secondary | ICD-10-CM

## 2019-10-29 DIAGNOSIS — E8881 Metabolic syndrome: Secondary | ICD-10-CM

## 2019-10-29 DIAGNOSIS — E66813 Obesity, class 3: Secondary | ICD-10-CM

## 2019-10-29 DIAGNOSIS — E559 Vitamin D deficiency, unspecified: Secondary | ICD-10-CM | POA: Diagnosis not present

## 2019-10-29 DIAGNOSIS — Z9189 Other specified personal risk factors, not elsewhere classified: Secondary | ICD-10-CM

## 2019-10-29 DIAGNOSIS — E7849 Other hyperlipidemia: Secondary | ICD-10-CM

## 2019-10-29 MED ORDER — VITAMIN D (ERGOCALCIFEROL) 1.25 MG (50000 UNIT) PO CAPS: 50000.0000 [IU] | ORAL_CAPSULE | ORAL | 0 refills | Status: DC

## 2019-10-29 MED ORDER — METFORMIN HCL 500 MG PO TABS: ORAL_TABLET | ORAL | 0 refills | Status: DC

## 2019-10-30 ENCOUNTER — Encounter (INDEPENDENT_AMBULATORY_CARE_PROVIDER_SITE_OTHER): Payer: Self-pay | Admitting: Bariatrics

## 2019-10-30 NOTE — Progress Notes (Signed)
Chief Complaint:   OBESITY Malik Jackson is here to discuss his progress with his obesity treatment plan along with follow-up of his obesity related diagnoses. Malik Jackson is keeping a food journal and adhering to recommended goals of 1800 calories and 90-115 grams of protein and states he is following his eating plan approximately 70% of the time. Malik Jackson states he is walking 30 minutes 7 times per week and mowing grass 90 minutes 1 time per week.  Today's visit was #: 6 Starting weight: 263 lbs Starting date: 07/23/2019 Today's weight: 259 lbs Today's date: 10/29/2019 Total lbs lost to date: 4 Total lbs lost since last in-office visit: 1  Interim History: Malik Jackson is down an additional lb. He has been busier at home and work. He reports having had a lot of celebrations.  Subjective:   Vitamin D deficiency. No nausea, vomiting, or muscle weakness. Last Vitamin D was 26.3 on 09/25/2019.  Other hyperlipidemia. Malik Jackson has hyperlipidemia and has been trying to improve his cholesterol levels with intensive lifestyle modification including a low saturated fat diet, exercise and weight loss. He denies any chest pain, claudication or myalgias. Malik Jackson is on no medication.  Lab Results  Component Value Date   ALT 47 (H) 09/25/2019   AST 23 09/25/2019   ALKPHOS 112 09/25/2019   BILITOT <0.2 09/25/2019   Lab Results  Component Value Date   CHOL 227 (H) 03/27/2019   HDL 43 03/27/2019   LDLCALC 150 (H) 03/27/2019   TRIG 186 (H) 03/27/2019   CHOLHDL 5.3 (H) 03/27/2019   Insulin resistance. Malik Jackson has a diagnosis of insulin resistance based on his elevated fasting insulin level >5. He continues to work on diet and exercise to decrease his risk of diabetes.  Lab Results  Component Value Date   INSULIN 45.0 (H) 07/23/2019   Lab Results  Component Value Date   HGBA1C 5.8 (H) 03/22/2019   At risk for hypoglycemia. Malik Jackson is at increased risk for hypoglycemia due to insulin resistance.    Assessment/Plan:   Vitamin D deficiency. Low Vitamin D level contributes to fatigue and are associated with obesity, breast, and colon cancer. He was given a prescription for Vitamin D, Ergocalciferol, (DRISDOL) 1.25 MG (50000 UNIT) CAPS capsule every week #4 with 0 refills and will follow-up for routine testing of Vitamin D, at least 2-3 times per year to avoid over-replacement.   Other hyperlipidemia. Cardiovascular risk and specific lipid/LDL goals reviewed.  We discussed several lifestyle modifications today and [name] will continue to work on diet, exercise and weight loss efforts. Orders and follow up as documented in patient record. He will decrease saturated fats, avoid trans fats, and increase exercise/activities.  Counseling Intensive lifestyle modifications are the first line treatment for this issue. . Dietary changes: Increase soluble fiber. Decrease simple carbohydrates. . Exercise changes: Moderate to vigorous-intensity aerobic activity 150 minutes per week if tolerated. . Lipid-lowering medications: see documented in medical record.  Insulin resistance. Malik Jackson will continue to work on weight loss, exercise, and decreasing simple carbohydrates to help decrease the risk of diabetes. Malik Jackson agreed to follow-up with [place] as directed to closely monitor his progress. Prescription was given for metformin 500 mg 1 PO daily at lunch #30 with 0 refills.  At risk for hypoglycemia. Malik Jackson was given approximately 15 minutes of counseling today regarding prevention of hypoglycemia. He was advised of symptoms of hypoglycemia. Malik Jackson was instructed to avoid skipping meals, eat regular protein rich meals and schedule low calorie snacks as needed.  Repetitive spaced learning was employed today to elicit superior memory formation and behavioral change.  Class 3 severe obesity with serious comorbidity and body mass index (BMI) of 40.0 to 44.9 in adult, unspecified obesity type ([place]).  Marland is  currently in the action stage of change. As such, his goal is to continue with weight loss efforts. He has agreed to keeping a food journal and adhering to recommended goals of 1800 calories and 90-115 grams of protein.   He will work on meal planning and intentional eating.   Exercise goals: All adults should avoid inactivity. Some physical activity is better than none, and adults who participate in any amount of physical activity gain some health benefits.  Behavioral modification strategies: increasing lean protein intake, decreasing simple carbohydrates, increasing vegetables, increasing water intake, decreasing eating out, no skipping meals, meal planning and cooking strategies, keeping healthy foods in the home and planning for success.  Malik Jackson has agreed to follow-up with our clinic in 1 month. He was informed of the importance of frequent follow-up visits to maximize his success with intensive lifestyle modifications for his multiple health conditions.   Objective:   Blood pressure 124/79, pulse 82, temperature 98.2 F (36.8 C), height 5\' 5"  (1.651 m), weight 259 lb (117.5 kg), SpO2 95 %. Body mass index is 43.1 kg/m.  General: Cooperative, alert, well developed, in no acute distress. HEENT: Conjunctivae and lids unremarkable. Cardiovascular: Regular rhythm.  Lungs: Normal work of breathing. Neurologic: No focal deficits.   Lab Results  Component Value Date   CREATININE 0.96 09/25/2019   BUN 17 09/25/2019   NA 138 09/25/2019   K 4.2 09/25/2019   CL 102 09/25/2019   CO2 22 09/25/2019   Lab Results  Component Value Date   ALT 47 (H) 09/25/2019   AST 23 09/25/2019   ALKPHOS 112 09/25/2019   BILITOT <0.2 09/25/2019   Lab Results  Component Value Date   HGBA1C 5.8 (H) 03/22/2019   HGBA1C 5.6 12/21/2017   HGBA1C 5.3 08/16/2016   Lab Results  Component Value Date   INSULIN 45.0 (H) 07/23/2019   Lab Results  Component Value Date   TSH 2.380 03/22/2019   Lab  Results  Component Value Date   CHOL 227 (H) 03/27/2019   HDL 43 03/27/2019   LDLCALC 150 (H) 03/27/2019   TRIG 186 (H) 03/27/2019   CHOLHDL 5.3 (H) 03/27/2019   Lab Results  Component Value Date   WBC 7.4 03/22/2019   HGB 17.6 03/22/2019   HCT 50.2 03/22/2019   MCV 87 03/22/2019   PLT 234 03/22/2019   Lab Results  Component Value Date   IRON 96 05/08/2019   FERRITIN 257.4 05/08/2019   Attestation Statements:   Reviewed by clinician on day of visit: allergies, medications, problem list, medical history, surgical history, family history, social history, and previous encounter notes.  Migdalia Dk, am acting as Location manager for [employer], DO   I have reviewed the above documentation for accuracy and completeness, and I agree with the above. Jearld Lesch, DO

## 2019-11-08 ENCOUNTER — Ambulatory Visit: Payer: BC Managed Care – PPO | Admitting: Family Medicine

## 2019-11-08 ENCOUNTER — Other Ambulatory Visit: Payer: Self-pay

## 2019-11-08 ENCOUNTER — Encounter: Payer: Self-pay | Admitting: Family Medicine

## 2019-11-08 ENCOUNTER — Other Ambulatory Visit: Payer: Self-pay | Admitting: Medical

## 2019-11-08 VITALS — BP 124/80 | HR 81 | Wt 259.8 lb

## 2019-11-08 DIAGNOSIS — I10 Essential (primary) hypertension: Secondary | ICD-10-CM

## 2019-11-08 DIAGNOSIS — E559 Vitamin D deficiency, unspecified: Secondary | ICD-10-CM | POA: Diagnosis not present

## 2019-11-08 DIAGNOSIS — G4733 Obstructive sleep apnea (adult) (pediatric): Secondary | ICD-10-CM

## 2019-11-08 DIAGNOSIS — R7303 Prediabetes: Secondary | ICD-10-CM | POA: Diagnosis not present

## 2019-11-08 DIAGNOSIS — E782 Mixed hyperlipidemia: Secondary | ICD-10-CM | POA: Diagnosis not present

## 2019-11-08 DIAGNOSIS — Z6841 Body Mass Index (BMI) 40.0 and over, adult: Secondary | ICD-10-CM

## 2019-11-08 LAB — POCT GLYCOSYLATED HEMOGLOBIN (HGB A1C): Hemoglobin A1C: 5.3 % (ref 4.0–5.6)

## 2019-11-08 MED ORDER — OLMESARTAN MEDOXOMIL-HCTZ 20-12.5 MG PO TABS: 1.0000 | ORAL_TABLET | Freq: Every day | ORAL | 1 refills | Status: DC

## 2019-11-08 NOTE — Addendum Note (Signed)
Addended by: Herminio Commons A on: 11/08/2019 02:01 PM   Modules accepted: Orders

## 2019-11-08 NOTE — Progress Notes (Signed)
   Subjective:    Patient ID: Malik Jackson, male    DOB: 1990-10-11, 29 y.o.   MRN: [id number]  HPI Chief Complaint  Patient presents with  . med check    med check. needs refill on bp   Here for a medication management visit. He is not fasting. Declines labs.   States his main reason for this visit is to get his blood pressure medication refilled.  Blood pressure has been normal at previous visits.  States he takes medication daily without any side effects.  He has been seeing [place] weight management and he has lost weight.  He plans to continue seeing them and is motivated.  Reports making healthy diet changes.   Prediabetes and insulin resistance. Started on metformin by Dr. Manson Passey.   Denies fever, chills, dizziness, chest pain, palpitations, shortness of breath, abdominal pain, N/V/D, urinary symptoms.  No polydipsia or polyuria.   Vitamin D def- he is taking a prescription vitamin D supplement prescribed by Weight [facility].   OSA- using his CPAP most days.   Review of Systems Pertinent positives and negatives in the history of present illness.     Objective:   Physical Exam BP 124/80   Pulse 81   Wt 259 lb 12.8 oz (117.8 kg)   BMI 43.23 kg/m   Alert and in no distress.  Cardiac exam shows a regular sinus rhythm without murmurs or gallops. Lungs are clear to auscultation. Skin is warm and dry. Mood is good.       Assessment & Plan:  Essential hypertension -His blood pressure is well controlled.  Continue on current medication.  Continue with healthy diet and exercise.  He is on a weight loss journey and is being closely followed by [place] weight [facility].  Mixed hyperlipidemia -He is not fasting today and declines lipid panel.  Recommend he have cholesterol checked in the next few months.  Continue with healthy diet and weight loss.  Prediabetes -Hemoglobin A1c is 5.3% today.  He was started on Metformin 500 mg once daily and he will discussed this  with Dr. Manson Passey at his follow-up later this month.  Discussed that he may still need to take this due to his increased risk of developing diabetes.  Vitamin D deficiency -This is being managed by [place] weight [facility].  He is currently taking once weekly prescription vitamin D supplement.  OSA (obstructive sleep apnea) -Reports only using his CPAP some nights.  Discussed the importance of treating sleep apnea and I recommend that he start using this nightly.  Class 3 severe obesity with serious comorbidity and body mass index (BMI) of 40.0 to 44.9 in adult, unspecified obesity type (HCC) -He has lost weight since his last visit.  He is encouraged and motivated.  Continue seeing [place] [facility].

## 2019-11-08 NOTE — Patient Instructions (Addendum)
Your hemoglobin A1c is 5.3% today. Discuss this with your weight management specialist and see if they want you to stay on the metformin. Continue your current medications.   I recommend using your CPAP nightly.   Keep [place] the good work with your weight loss journey!

## 2019-11-18 ENCOUNTER — Other Ambulatory Visit (INDEPENDENT_AMBULATORY_CARE_PROVIDER_SITE_OTHER): Payer: Self-pay | Admitting: Bariatrics

## 2019-11-29 ENCOUNTER — Ambulatory Visit (INDEPENDENT_AMBULATORY_CARE_PROVIDER_SITE_OTHER): Payer: BC Managed Care – PPO | Admitting: Bariatrics

## 2019-12-04 ENCOUNTER — Ambulatory Visit (INDEPENDENT_AMBULATORY_CARE_PROVIDER_SITE_OTHER): Payer: BC Managed Care – PPO | Admitting: Family Medicine

## 2019-12-04 ENCOUNTER — Other Ambulatory Visit: Payer: Self-pay

## 2019-12-04 ENCOUNTER — Encounter (INDEPENDENT_AMBULATORY_CARE_PROVIDER_SITE_OTHER): Payer: Self-pay | Admitting: Family Medicine

## 2019-12-04 VITALS — BP 112/74 | HR 99 | Temp 97.9°F | Ht 65.0 in | Wt 257.0 lb

## 2019-12-04 DIAGNOSIS — I10 Essential (primary) hypertension: Secondary | ICD-10-CM

## 2019-12-04 DIAGNOSIS — Z9189 Other specified personal risk factors, not elsewhere classified: Secondary | ICD-10-CM | POA: Diagnosis not present

## 2019-12-04 DIAGNOSIS — E559 Vitamin D deficiency, unspecified: Secondary | ICD-10-CM | POA: Diagnosis not present

## 2019-12-04 DIAGNOSIS — Z6841 Body Mass Index (BMI) 40.0 and over, adult: Secondary | ICD-10-CM

## 2019-12-04 DIAGNOSIS — R7303 Prediabetes: Secondary | ICD-10-CM

## 2019-12-04 MED ORDER — VITAMIN D (ERGOCALCIFEROL) 1.25 MG (50000 UNIT) PO CAPS: 50000.0000 [IU] | ORAL_CAPSULE | ORAL | 0 refills | Status: DC

## 2019-12-05 NOTE — Progress Notes (Signed)
Chief Complaint:   OBESITY Malik Jackson is here to discuss his progress with his obesity treatment plan along with follow-up of his obesity related diagnoses. Malik Jackson is on keeping a food journal and adhering to recommended goals of 1800 calories and 90-115 grams of protein daily and states he is following his eating plan approximately 60% of the time. Malik Jackson states he is walking and doing light exercise for 30 minutes 2-3 times per week.  Today's visit was #: 7 Starting weight: 263 lbs Starting date: 07/23/2019 Today's weight: 263 lbs Today's date: 12/04/2019 Total lbs lost to date: 6 Total lbs lost since last in-office visit: 2  Interim History: Malik Jackson continues to do well with weight loss. He was changed to journaling and he does well meeting his protein goal, but often exceeds his calorie goals. He struggles to journal his snacks.  Subjective:   1. Pre-diabetes Malik Jackson's last A1c has greatly improved with diet and metformin, and is now at 5.3.  2. Essential hypertension Malik Jackson's blood pressure is well controlled on his medications. He denies chest pain or lightheadedness. He is working on diet and weight loss.  3. Vitamin D deficiency Malik Jackson's Vit D level is stable but not yet at goal. He denies nausea or vomiting.  4. At risk for diabetes mellitus Malik Jackson is at higher than average risk for developing diabetes due to his obesity.   Assessment/Plan:   1. Pre-diabetes Malik Jackson will continue metformin as is, and continue to work on weight loss, diet, exercise, and decreasing simple carbohydrates to help decrease the risk of diabetes.  2. Essential hypertension Malik Jackson will continue his medications, and will continue working on healthy weight loss and exercise to improve blood pressure control. We will watch for signs of hypotension as he continues his lifestyle modifications.  3. Vitamin D deficiency Low Vitamin D level contributes to fatigue and are associated with obesity, breast,  and colon cancer. We will refill prescription Vitamin D for 1 month. Malik Jackson will follow-up for routine testing of Vitamin D, at least 2-3 times per year to avoid over-replacement.  - Vitamin D, Ergocalciferol, (DRISDOL) 1.25 MG (50000 UNIT) CAPS capsule; Take 1 capsule (50,000 Units total) by mouth every 7 (seven) days.  Dispense: 4 capsule; Refill: 0  4. At risk for diabetes mellitus Malik Jackson was given approximately 15 minutes of diabetes education and counseling today. We discussed intensive lifestyle modifications today with an emphasis on weight loss as well as increasing exercise and decreasing simple carbohydrates in his diet. We also reviewed medication options with an emphasis on risk versus benefit of those discussed.   Repetitive spaced learning was employed today to elicit superior memory formation and behavioral change.  5. Class 3 severe obesity with serious comorbidity and body mass index (BMI) of 40.0 to 44.9 in adult, unspecified obesity type (HCC) Malik Jackson is currently in the action stage of change. As such, his goal is to continue with weight loss efforts. He has agreed to keeping a food journal and adhering to recommended goals of 1800 calories and 90-115 grams of protein daily.   100 calorie snack ideas given today.  Exercise goals: As is.  Behavioral modification strategies: increasing lean protein intake.  Malik Jackson has agreed to follow-up with our clinic in 4 weeks. He was informed of the importance of frequent follow-up visits to maximize his success with intensive lifestyle modifications for his multiple health conditions.   Objective:   Blood pressure 112/74, pulse 99, temperature 97.9 F (36.6 C), temperature source  Oral, height 5\' 5"  (1.651 m), weight (!) 257 lb (116.6 kg), SpO2 95 %. Body mass index is 42.77 kg/m.  General: Cooperative, alert, well developed, in no acute distress. HEENT: Conjunctivae and lids unremarkable. Cardiovascular: Regular rhythm.  Lungs:  Normal work of breathing. Neurologic: No focal deficits.   Lab Results  Component Value Date   CREATININE 0.96 09/25/2019   BUN 17 09/25/2019   NA 138 09/25/2019   K 4.2 09/25/2019   CL 102 09/25/2019   CO2 22 09/25/2019   Lab Results  Component Value Date   ALT 47 (H) 09/25/2019   AST 23 09/25/2019   ALKPHOS 112 09/25/2019   BILITOT <0.2 09/25/2019   Lab Results  Component Value Date   HGBA1C 5.3 11/08/2019   HGBA1C 5.8 (H) 03/22/2019   HGBA1C 5.6 12/21/2017   HGBA1C 5.3 08/16/2016   Lab Results  Component Value Date   INSULIN 45.0 (H) 07/23/2019   Lab Results  Component Value Date   TSH 2.380 03/22/2019   Lab Results  Component Value Date   CHOL 227 (H) 03/27/2019   HDL 43 03/27/2019   LDLCALC 150 (H) 03/27/2019   TRIG 186 (H) 03/27/2019   CHOLHDL 5.3 (H) 03/27/2019   Lab Results  Component Value Date   WBC 7.4 03/22/2019   HGB 17.6 03/22/2019   HCT 50.2 03/22/2019   MCV 87 03/22/2019   PLT 234 03/22/2019   Lab Results  Component Value Date   IRON 96 05/08/2019   FERRITIN 257.4 05/08/2019   Attestation Statements:   Reviewed by clinician on day of visit: allergies, medications, problem list, medical history, surgical history, family history, social history, and previous encounter notes.   I, Maijor Hornig, am acting as transcriptionist for Burt Knack, MD.  I have reviewed the above documentation for accuracy and completeness, and I agree with the above. -  Quillian Quince, MD

## 2019-12-06 ENCOUNTER — Other Ambulatory Visit (INDEPENDENT_AMBULATORY_CARE_PROVIDER_SITE_OTHER): Payer: Self-pay | Admitting: Bariatrics

## 2019-12-06 MED ORDER — METFORMIN HCL 500 MG PO TABS: ORAL_TABLET | ORAL | 0 refills | Status: DC

## 2019-12-27 ENCOUNTER — Other Ambulatory Visit (INDEPENDENT_AMBULATORY_CARE_PROVIDER_SITE_OTHER): Payer: Self-pay | Admitting: Family Medicine

## 2020-01-01 ENCOUNTER — Encounter (INDEPENDENT_AMBULATORY_CARE_PROVIDER_SITE_OTHER): Payer: Self-pay | Admitting: Family Medicine

## 2020-01-01 ENCOUNTER — Ambulatory Visit (INDEPENDENT_AMBULATORY_CARE_PROVIDER_SITE_OTHER): Payer: BC Managed Care – PPO | Admitting: Family Medicine

## 2020-01-01 ENCOUNTER — Other Ambulatory Visit: Payer: Self-pay

## 2020-01-01 VITALS — BP 129/85 | HR 73 | Temp 98.7°F | Ht 65.0 in | Wt 254.0 lb

## 2020-01-01 DIAGNOSIS — Z6841 Body Mass Index (BMI) 40.0 and over, adult: Secondary | ICD-10-CM

## 2020-01-01 DIAGNOSIS — I10 Essential (primary) hypertension: Secondary | ICD-10-CM

## 2020-01-01 NOTE — Progress Notes (Signed)
Chief Complaint:   OBESITY Mackie is here to discuss his progress with his obesity treatment plan along with follow-up of his obesity related diagnoses. Mahonri is on keeping a food journal and adhering to recommended goals of 1800 calories and 90-115 grams of protein daily and states he is following his eating plan approximately 60% of the time. Shaheed states he is doing 0 minutes 0 times per week.  Today's visit was #: 8 Starting weight: 263 lbs Starting date: 07/23/2019 Today's weight: 254 lbs Today's date: 01/01/2020 Total lbs lost to date: 9 Total lbs lost since last in-office visit: 3  Interim History: Tavaras was changed to the journaling plan and he has done well with it. His hunger is controlled and he is doing well meeting his calories and protein goals.  Subjective:   1. Essential hypertension Brae's blood pressure is well controlled on his medications, and has improved with weight loss. He is hoping to improve enough to come off his medications eventually. He denies feeling lightheaded.  Assessment/Plan:   1. Essential hypertension Nayshawn will continue working on healthy weight loss, diet, and exercise to improve blood pressure control. We will watch for signs of hypotension as he continues his lifestyle modifications.  2. Class 3 severe obesity with serious comorbidity and body mass index (BMI) of 40.0 to 44.9 in adult, unspecified obesity type (HCC) Alvey is currently in the action stage of change. As such, his goal is to continue with weight loss efforts. He has agreed to keeping a food journal and adhering to recommended goals of 1800 calories and 115 grams of protein daily.   Shredded [employer] given today.  Exercise goals: Start counting steps for a baseline.  Behavioral modification strategies: increasing lean protein intake and meal planning and cooking strategies.  Sheila has agreed to follow-up with our clinic in 2 weeks. He was informed of the  importance of frequent follow-up visits to maximize his success with intensive lifestyle modifications for his multiple health conditions.   Objective:   Blood pressure 129/85, pulse 73, temperature 98.7 F (37.1 C), temperature source Oral, height 5\' 5"  (1.651 m), weight 254 lb (115.2 kg), SpO2 95 %. Body mass index is 42.27 kg/m.  General: Cooperative, alert, well developed, in no acute distress. HEENT: Conjunctivae and lids unremarkable. Cardiovascular: Regular rhythm.  Lungs: Normal work of breathing. Neurologic: No focal deficits.   Lab Results  Component Value Date   CREATININE 0.96 09/25/2019   BUN 17 09/25/2019   NA 138 09/25/2019   K 4.2 09/25/2019   CL 102 09/25/2019   CO2 22 09/25/2019   Lab Results  Component Value Date   ALT 47 (H) 09/25/2019   AST 23 09/25/2019   ALKPHOS 112 09/25/2019   BILITOT <0.2 09/25/2019   Lab Results  Component Value Date   HGBA1C 5.3 11/08/2019   HGBA1C 5.8 (H) 03/22/2019   HGBA1C 5.6 12/21/2017   HGBA1C 5.3 08/16/2016   Lab Results  Component Value Date   INSULIN 45.0 (H) 07/23/2019   Lab Results  Component Value Date   TSH 2.380 03/22/2019   Lab Results  Component Value Date   CHOL 227 (H) 03/27/2019   HDL 43 03/27/2019   LDLCALC 150 (H) 03/27/2019   TRIG 186 (H) 03/27/2019   CHOLHDL 5.3 (H) 03/27/2019   Lab Results  Component Value Date   WBC 7.4 03/22/2019   HGB 17.6 03/22/2019   HCT 50.2 03/22/2019   MCV 87 03/22/2019  PLT 234 03/22/2019   Lab Results  Component Value Date   IRON 96 05/08/2019   FERRITIN 257.4 05/08/2019   Attestation Statements:   Reviewed by clinician on day of visit: allergies, medications, problem list, medical history, surgical history, family history, social history, and previous encounter notes.  Time spent on visit including pre-visit chart review and post-visit care and charting was 21 minutes.    I, Burns Timson, am acting as transcriptionist for Quillian Quince, MD.  I  have reviewed the above documentation for accuracy and completeness, and I agree with the above. -  Quillian Quince, MD

## 2020-01-21 ENCOUNTER — Ambulatory Visit (INDEPENDENT_AMBULATORY_CARE_PROVIDER_SITE_OTHER): Payer: BC Managed Care – PPO | Admitting: Family Medicine

## 2020-03-18 DIAGNOSIS — Z23 Encounter for immunization: Secondary | ICD-10-CM | POA: Diagnosis not present

## 2020-06-11 DIAGNOSIS — Z9119 Patient's noncompliance with other medical treatment and regimen: Secondary | ICD-10-CM | POA: Diagnosis not present

## 2020-06-11 DIAGNOSIS — H6691 Otitis media, unspecified, right ear: Secondary | ICD-10-CM | POA: Diagnosis not present

## 2020-06-11 DIAGNOSIS — I10 Essential (primary) hypertension: Secondary | ICD-10-CM | POA: Diagnosis not present

## 2021-12-16 ENCOUNTER — Encounter (INDEPENDENT_AMBULATORY_CARE_PROVIDER_SITE_OTHER): Payer: Self-pay

## 2022-01-23 ENCOUNTER — Emergency Department (HOSPITAL_COMMUNITY)
Admission: EM | Admit: 2022-01-23 | Discharge: 2022-01-23 | Disposition: A | Discharge status: Home / Self Care | Payer: BC Managed Care – PPO | Attending: Emergency Medicine | Admitting: Emergency Medicine

## 2022-01-23 ENCOUNTER — Emergency Department (HOSPITAL_COMMUNITY): Payer: BC Managed Care – PPO

## 2022-01-23 ENCOUNTER — Encounter (HOSPITAL_COMMUNITY): Payer: Self-pay | Admitting: Emergency Medicine

## 2022-01-23 ENCOUNTER — Other Ambulatory Visit: Payer: Self-pay

## 2022-01-23 DIAGNOSIS — Z79899 Other long term (current) drug therapy: Secondary | ICD-10-CM | POA: Insufficient documentation

## 2022-01-23 DIAGNOSIS — E876 Hypokalemia: Secondary | ICD-10-CM | POA: Diagnosis not present

## 2022-01-23 DIAGNOSIS — N329 Bladder disorder, unspecified: Secondary | ICD-10-CM | POA: Insufficient documentation

## 2022-01-23 DIAGNOSIS — I10 Essential (primary) hypertension: Secondary | ICD-10-CM | POA: Diagnosis not present

## 2022-01-23 DIAGNOSIS — R072 Precordial pain: Secondary | ICD-10-CM

## 2022-01-23 DIAGNOSIS — R0789 Other chest pain: Secondary | ICD-10-CM | POA: Diagnosis present

## 2022-01-23 DIAGNOSIS — Z91148 Patient's other noncompliance with medication regimen for other reason: Secondary | ICD-10-CM

## 2022-01-23 LAB — BASIC METABOLIC PANEL
Anion gap: 10 (ref 5–15)
BUN: 12 mg/dL (ref 6–20)
CO2: 25 mmol/L (ref 22–32)
Calcium: 9.3 mg/dL (ref 8.9–10.3)
Chloride: 102 mmol/L (ref 98–111)
Creatinine, Ser: 0.96 mg/dL (ref 0.61–1.24)
GFR, Estimated: >60 mL/min (ref >60)
Glucose, Bld: 115 mg/dL — ABNORMAL HIGH (ref 70–99)
Potassium: 3.3 mmol/L — ABNORMAL LOW (ref 3.5–5.1)
Sodium: 137 mmol/L (ref 135–145)

## 2022-01-23 LAB — TROPONIN I (HIGH SENSITIVITY)
Troponin I (High Sensitivity): 10 ng/L (ref <18)
Troponin I (High Sensitivity): 13 ng/L (ref <18)
Troponin I (High Sensitivity): 12 ng/L (ref <18)
Troponin I (High Sensitivity): 10 ng/L (ref <18)

## 2022-01-23 LAB — CBC
HCT: 50.2 % (ref 39.0–52.0)
Hemoglobin: 17.7 g/dL — ABNORMAL HIGH (ref 13.0–17.0)
MCH: 30.5 pg (ref 26.0–34.0)
MCHC: 35.3 g/dL (ref 30.0–36.0)
MCV: 86.4 fL (ref 80.0–100.0)
Platelets: 223 10*3/uL (ref 150–400)
RBC: 5.81 MIL/uL (ref 4.22–5.81)
RDW: 13.2 % (ref 11.5–15.5)
WBC: 7.5 10*3/uL (ref 4.0–10.5)
nRBC: 0 % (ref 0.0–0.2)

## 2022-01-23 MED ORDER — ALUM & MAG HYDROXIDE-SIMETH 200-200-20 MG/5ML PO SUSP
30.0000 mL | Freq: Once | ORAL | Status: AC
  Administered 2022-01-23: 30 mL via ORAL
  Filled 2022-01-23: qty 30

## 2022-01-23 MED ORDER — IOHEXOL 350 MG/ML SOLN
100.0000 mL | Freq: Once | INTRAVENOUS | Status: AC | PRN
  Administered 2022-01-23: 100 mL via INTRAVENOUS

## 2022-01-23 MED ORDER — OLMESARTAN-AMLODIPINE-HCTZ 20-5-12.5 MG PO TABS: 1.0000 | ORAL_TABLET | Freq: Every day | ORAL | 0 refills | Status: DC

## 2022-01-23 MED ORDER — OLMESARTAN MEDOXOMIL-HCTZ 20-12.5 MG PO TABS: 1.0000 | ORAL_TABLET | Freq: Once | ORAL | Status: DC

## 2022-01-23 MED ORDER — HYDROCHLOROTHIAZIDE 12.5 MG PO TABS
12.5000 mg | ORAL_TABLET | Freq: Every day | ORAL | Status: DC
  Administered 2022-01-23: 12.5 mg via ORAL
  Filled 2022-01-23: qty 1

## 2022-01-23 MED ORDER — ACETAMINOPHEN 500 MG PO TABS
1000.0000 mg | ORAL_TABLET | Freq: Once | ORAL | Status: AC
  Administered 2022-01-23: 1000 mg via ORAL
  Filled 2022-01-23: qty 2

## 2022-01-23 MED ORDER — IRBESARTAN 300 MG PO TABS
150.0000 mg | ORAL_TABLET | Freq: Every day | ORAL | Status: DC
  Administered 2022-01-23: 150 mg via ORAL
  Filled 2022-01-23: qty 1

## 2022-01-23 MED ORDER — FAMOTIDINE 20 MG PO TABS
20.0000 mg | ORAL_TABLET | Freq: Once | ORAL | Status: AC
  Administered 2022-01-23: 20 mg via ORAL
  Filled 2022-01-23: qty 1

## 2022-01-23 MED ORDER — POTASSIUM CHLORIDE 20 MEQ PO PACK
40.0000 meq | PACK | Freq: Once | ORAL | Status: AC
  Administered 2022-01-23: 40 meq via ORAL
  Filled 2022-01-23: qty 2

## 2022-01-23 NOTE — ED Triage Notes (Signed)
Patient here with complaint of chest pain that started last night a few hours after drinking an energy drink while at work. Patient describes pain as a pressure on the left side of chest. Patient is alert, oriented, speaking in complete sentences, and is in no apparent distress at this time. BP 207/141 in triage, denies history of hypertension.

## 2022-01-23 NOTE — Discharge Instructions (Addendum)
It was our pleasure to provide your ER care today - we hope that you feel better.  Your blood pressure is high - take medication as prescribed, limit salt intake, eat heart health meal plan, and follow up closely with primary care doctor in one week.  For chest discomfort, follow up closely with cardiologist.   Your potassium level is mildly low - eat plenty of fruits and vegetables, and follow up with primary care doctor in one week.   Return to ER if worse, new symptoms, recurrent or persistent chest pain, increased trouble breathing, or other concern.

## 2022-01-23 NOTE — ED Provider Notes (Signed)
[facility] EMERGENCY DEPARTMENT Provider Note   CSN: [id number] Arrival date & time: 01/23/22  1058     History  Chief Complaint  Patient presents with   Chest Pain    Malik Jackson is a 31 y.o. male.  Pt with mid chest discomfort/pressure sensation in the past two days. Symptoms onset at rest, moderate, non radiating, not pleuritic.  No associated sob, nv or diaphoresis. No cough or uri symptoms. No fever or chills. No heartburn. No leg pain or swelling. No hx cad or fam hx premature cad. Hx htn, but not taking meds for months - states had been on olmesartan/hctz and did tolerate well. No hx dvt or pe. No neck or thoracic/back pain. No headache. No change in speech or vision. No numbness/weakness or change in normal/baseline functional ability.   The history is provided by the patient, medical records and a significant other.  Chest Pain Associated symptoms: no abdominal pain, no back pain, no fever, no headache, no palpitations, no shortness of breath and no vomiting        Home Medications Prior to Admission medications   Medication Sig Start Date End Date Taking? Authorizing Provider  metFORMIN (GLUCOPHAGE) 500 MG tablet One po qd @ lunch 12/06/19   Dennard Nip D, MD  olmesartan-hydrochlorothiazide (BENICAR HCT) 20-12.5 MG tablet Take 1 tablet by mouth daily. 11/08/19   Henson, Vickie L, NP-C  Vitamin D, Ergocalciferol, (DRISDOL) 1.25 MG (50000 UNIT) CAPS capsule Take 1 capsule (50,000 Units total) by mouth every 7 (seven) days. 12/04/19   Starlyn Skeans, MD      Allergies    Patient has no known allergies.    Review of Systems   Review of Systems  Constitutional:  Negative for fever.  HENT:  Negative for sore throat.   Eyes:  Negative for redness.  Respiratory:  Negative for shortness of breath.   Cardiovascular:  Positive for chest pain. Negative for palpitations and leg swelling.  Gastrointestinal:  Negative for abdominal pain and vomiting.   Genitourinary:  Negative for flank pain.  Musculoskeletal:  Negative for back pain and neck pain.  Skin:  Negative for rash.  Neurological:  Negative for headaches.  Hematological:  Does not bruise/bleed easily.  Psychiatric/Behavioral:  Negative for confusion.     Physical Exam Updated Vital Signs BP (!) 207/141 (BP Location: Right Arm)   Pulse 73   Temp 98.3 F (36.8 C)   Resp 17   SpO2 95%  Physical Exam Vitals and nursing note reviewed.  Constitutional:      Appearance: Normal appearance. He is well-developed.  HENT:     Head: Atraumatic.     Nose: Nose normal.     Mouth/Throat:     Mouth: Mucous membranes are moist.     Pharynx: Oropharynx is clear.  Eyes:     General: No scleral icterus.    Conjunctiva/sclera: Conjunctivae normal.     Pupils: Pupils are equal, round, and reactive to light.  Neck:     Trachea: No tracheal deviation.  Cardiovascular:     Rate and Rhythm: Normal rate and regular rhythm.     Pulses: Normal pulses.     Heart sounds: Normal heart sounds. No murmur heard.    No friction rub. No gallop.  Pulmonary:     Effort: Pulmonary effort is normal. No accessory muscle usage or respiratory distress.     Breath sounds: Normal breath sounds.  Abdominal:     General: There is  no distension.     Palpations: Abdomen is soft. There is no mass.     Tenderness: There is no abdominal tenderness.  Genitourinary:    Comments: No cva tenderness. Musculoskeletal:        General: No swelling or tenderness.     Cervical back: Normal range of motion and neck supple. No rigidity.     Right lower leg: No edema.     Left lower leg: No edema.  Skin:    General: Skin is warm and dry.     Findings: No rash.  Neurological:     Mental Status: He is alert.     Comments: Alert, speech clear.   Psychiatric:        Mood and Affect: Mood normal.     ED Results / Procedures / Treatments   Labs (all labs ordered are listed, but only abnormal results are  displayed) Results for orders placed or performed during the hospital encounter of [id number]  Basic metabolic panel  Result Value Ref Range   Sodium 137 135 - 145 mmol/L   Potassium 3.3 (L) 3.5 - 5.1 mmol/L   Chloride 102 98 - 111 mmol/L   CO2 25 22 - 32 mmol/L   Glucose, Bld 115 (H) 70 - 99 mg/dL   BUN 12 6 - 20 mg/dL   Creatinine, Ser 0.96 0.61 - 1.24 mg/dL   Calcium 9.3 8.9 - 10.3 mg/dL   GFR, Estimated >60 >60 mL/min   Anion gap 10 5 - 15  CBC  Result Value Ref Range   WBC 7.5 4.0 - 10.5 K/uL   RBC 5.81 4.22 - 5.81 MIL/uL   Hemoglobin 17.7 (H) 13.0 - 17.0 g/dL   HCT 50.2 39.0 - 52.0 %   MCV 86.4 80.0 - 100.0 fL   MCH 30.5 26.0 - 34.0 pg   MCHC 35.3 30.0 - 36.0 g/dL   RDW 13.2 11.5 - 15.5 %   Platelets 223 150 - 400 K/uL   nRBC 0.0 0.0 - 0.2 %  Troponin I (High Sensitivity)  Result Value Ref Range   Troponin I (High Sensitivity) 10 <18 ng/L   DG Chest 2 View  Result Date: 01/23/2022 CLINICAL DATA:  Chest pain. EXAM: CHEST - 2 VIEW COMPARISON:  None Available. FINDINGS: The heart size and mediastinal contours are within normal limits. Both lungs are clear. The visualized skeletal structures are unremarkable. IMPRESSION: No active cardiopulmonary disease. Electronically Signed   By: Zerita Boers M.D.   On: 01/23/2022 12:26     EKG EKG Interpretation  Date/Time:  Saturday January 23 2022 11:16:19 EDT Ventricular Rate:  72 PR Interval:  140 QRS Duration: 86 QT Interval:  390 QTC Calculation: 427 R Axis:   47 Text Interpretation: Normal sinus rhythm Nonspecific T wave abnormality No previous ECGs available Confirmed by Lajean Saver [phone]) on 01/23/2022 12:15:47 PM  Radiology DG Chest 2 View  Result Date: 01/23/2022 CLINICAL DATA:  Chest pain. EXAM: CHEST - 2 VIEW COMPARISON:  None Available. FINDINGS: The heart size and mediastinal contours are within normal limits. Both lungs are clear. The visualized skeletal structures are unremarkable. IMPRESSION: No active  cardiopulmonary disease. Electronically Signed   By: Zerita Boers M.D.   On: 01/23/2022 12:26    Procedures Procedures    Medications Ordered in ED Medications - No data to display  ED Course/ Medical Decision Making/ A&P  Medical Decision Making Problems Addressed: Hypokalemia: acute illness or injury Non compliance w medication regimen: chronic illness or injury Precordial chest pain: acute illness or injury with systemic symptoms that poses a threat to life or bodily functions Uncontrolled hypertension: chronic illness or injury with exacerbation, progression, or side effects of treatment that poses a threat to life or bodily functions  Amount and/or Complexity of Data Reviewed Independent Historian:     Details: Family/friend, hx External Data Reviewed: notes. Labs: ordered. Decision-making details documented in ED Course. Radiology: ordered and independent interpretation performed. Decision-making details documented in ED Course. ECG/medicine tests: ordered and independent interpretation performed. Decision-making details documented in ED Course.  Risk OTC drugs. Prescription drug management. Decision regarding hospitalization.   Iv ns. Continuous pulse ox and cardiac monitoring. Labs ordered/sent. Imaging ordered.   Diff dx includes acs, msk cp, gi cp, uncontrolled htn, aki, etc - dispo decision including potential need for admission considered - will get labs and imaging and reassess.   Reviewed nursing notes and prior charts for additional history. External reports reviewed. Additional history from: family/so.  Cardiac monitor: sinus rhythm, rate 72.  Labs reviewed/interpreted by me - trop normal. K mildly low. Kcl po.   Xrays reviewed/interpreted by me - no pna.   Acetaminophen, pepcid, maalox po. Pt given dose of his normal bp med.   Recheck, bp improved. Pain improved.   1525, cta study and delta troponin remain pending - signed  out to Dr Wyvonnia Dusky, if cta neg, and delta trop normal/not increasing - feel likely patient stable for d/c w close outpatient f/u.           Final Clinical Impression(s) / ED Diagnoses Final diagnoses:  None    Rx / DC Orders ED Discharge Orders     None         Lajean Saver, MD 01/23/22 1529

## 2022-01-23 NOTE — ED Provider Notes (Signed)
Care assumed from Dr. Ashok Cordia.  Patient with mid chest discomfort for the past 2 days is not pleuritic or exertional.  EKG is nonischemic.  Troponins are negative.  Pending CT angiogram of his chest to rule out aortic pathology.  Has been out of his blood pressure medication for several months.  Blood pressure now improved.  Chest pain has resolved. Pain atypical for ACS  CT is negative for any dissection or other acute pathology. No evidence of aortic dissection or pulmonary embolism.  Troponin remains negative.  BP improved to 135/93.  Followup with PCP and cardiology. Return to the ED with exertional chest pain, pain associated with SOB, nausea, vomiting, sweating or any other concerns.   Ezequiel Essex, MD 01/24/22 0111

## 2022-02-23 NOTE — Progress Notes (Deleted)
Cardiology Office Note:    Date:  02/23/2022   ID:  Malik Jackson, DOB 1991/05/10, MRN [id number]  PCP:  Avanell Shackleton, NP-C   [facility] Providers Cardiologist:  None {  Referring MD: Cathren Laine, MD    History of Present Illness:    Malik Jackson is a 31 y.o. male with a hx of HTN, OSA, fatty liver disease, morbid obesity, HLD, and prediabetes who was referred by Dr. Denton Lank for further evaluation of chest pain.  Patient was seen in the ER on 01/23/22. Note reviewed. He presented with chest pressure for 2 days. Trop negative x2. ECG with NSR with nonspecific ST-T wave changes. Given reassuring work-up, he was discharged home with CV follow-up.  Today, ***  Past Medical History:  Diagnosis Date   Abnormal liver function tests 03/28/2019   Back pain    Edema of both lower extremities    Fatty liver    HTN (hypertension)    diagnosed 2 years ago   Morbid obesity (HCC)    OSA on CPAP     Past Surgical History:  Procedure Laterality Date   APPENDECTOMY      Current Medications: No outpatient medications have been marked as taking for the 02/26/22 encounter (Appointment) with Meriam Sprague, MD.     Allergies:   Patient has no known allergies.   Social History   Socioeconomic History   Marital status: Married    Spouse name: [name]   Number of children: Not on file   Years of education: Not on file   Highest education level: Not on file  Occupational History   Occupation: [employer]  Tobacco Use   Smoking status: Never   Smokeless tobacco: Never  Vaping Use   Vaping Use: Never used  Substance and Sexual Activity   Alcohol use: Yes    Comment: 8 beers per week    Drug use: No   Sexual activity: Yes    Birth control/protection: None  Other Topics Concern   Not on file  Social History Narrative   Not on file   Social Determinants of Health   Financial Resource Strain: Not on file  Food Insecurity: Not on file   Transportation Needs: Not on file  Physical Activity: Not on file  Stress: Not on file  Social Connections: Not on file     Family History: The patient's ***family history includes Alcoholism in his father; Diabetes in his father; High Cholesterol in his father; Hyperlipidemia in his mother; Hypertension in his maternal aunt; Sleep apnea in his father. There is no history of Colon cancer, Stomach cancer, Pancreatic cancer, or Esophageal cancer.  ROS:   Please see the history of present illness.    *** All other systems reviewed and are negative.  EKGs/Labs/Other Studies Reviewed:    The following studies were reviewed today: ***  EKG:  EKG is *** ordered today.  The ekg ordered today demonstrates ***  Recent Labs: 01/23/2022: BUN 12; Creatinine, Ser 0.96; Hemoglobin 17.7; Platelets 223; Potassium 3.3; Sodium 137  Recent Lipid Panel    Component Value Date/Time   CHOL 227 (H) 03/27/2019 0841   TRIG 186 (H) 03/27/2019 0841   HDL 43 03/27/2019 0841   CHOLHDL 5.3 (H) 03/27/2019 0841   CHOLHDL 6.8 (H) 09/22/2016 0928   VLDL 73 (H) 09/22/2016 0928   LDLCALC 150 (H) 03/27/2019 0841     Risk Assessment/Calculations:   {Does this patient have ATRIAL FIBRILLATION?[phone]}  No BP recorded.  {  Refresh Note OR Click here to enter BP  :1}***         Physical Exam:    VS:  There were no vitals taken for this visit.    Wt Readings from Last 3 Encounters:  01/23/22 250 lb (113.4 kg)  01/01/20 254 lb (115.2 kg)  12/04/19 (!) 257 lb (116.6 kg)     GEN: *** Well nourished, well developed in no acute distress HEENT: Normal NECK: No JVD; No carotid bruits LYMPHATICS: No lymphadenopathy CARDIAC: ***RRR, no murmurs, rubs, gallops RESPIRATORY:  Clear to auscultation without rales, wheezing or rhonchi  ABDOMEN: Soft, non-tender, non-distended MUSCULOSKELETAL:  No edema; No deformity  SKIN: Warm and dry NEUROLOGIC:  Alert and oriented x 3 PSYCHIATRIC:  Normal affect    ASSESSMENT:    No diagnosis found. PLAN:    In order of problems listed above:  #Chest Pain: Work-up reassuring in the ER. Trops negative. ECG nonischemic.  -***check coronary CTA  #HTN: -Continue olmesartan-amlodipine-HCTZ 20-5-12.5mg  daily   #Morbid Obesity: BMI *** -Lifestyle modifications as below -***Ozempic***  Exercise recommendations: Goal of exercising for at least 30 minutes a day, at least 5 times per week.  Please exercise to a moderate exertion.  This means that while exercising it is difficult to speak in full sentences, however you are not so short of breath that you feel you must stop, and not so comfortable that you can carry on a full conversation.  Exertion level should be approximately a 5/10, if 10 is the most exertion you can perform.  Diet recommendations: Recommend a heart healthy diet such as the Mediterranean diet.  This diet consists of plant based foods, healthy fats, lean meats, olive oil.  It suggests limiting the intake of simple carbohydrates such as white breads, pastries, and pastas.  It also limits the amount of red meat, wine, and dairy products such as cheese that one should consume on a daily basis.       {Are you ordering a CV Procedure (e.g. stress test, cath, DCCV, TEE, etc)?   Press F2        :[phone]}    Medication Adjustments/Labs and Tests Ordered: Current medicines are reviewed at length with the patient today.  Concerns regarding medicines are outlined above.  No orders of the defined types were placed in this encounter.  No orders of the defined types were placed in this encounter.   There are no Patient Instructions on file for this visit.   Signed, Freada Bergeron, MD  02/23/2022 2:09 PM    [place]

## 2022-02-26 ENCOUNTER — Ambulatory Visit: Payer: BC Managed Care – PPO | Attending: Cardiology | Admitting: Cardiology

## 2022-02-26 ENCOUNTER — Encounter: Payer: Self-pay | Admitting: Cardiology

## 2022-02-26 VITALS — BP 118/90 | HR 95 | Ht 64.0 in | Wt 254.0 lb

## 2022-02-26 DIAGNOSIS — E7849 Other hyperlipidemia: Secondary | ICD-10-CM

## 2022-02-26 DIAGNOSIS — I10 Essential (primary) hypertension: Secondary | ICD-10-CM | POA: Diagnosis not present

## 2022-02-26 DIAGNOSIS — R072 Precordial pain: Secondary | ICD-10-CM

## 2022-02-26 DIAGNOSIS — K760 Fatty (change of) liver, not elsewhere classified: Secondary | ICD-10-CM | POA: Diagnosis not present

## 2022-02-26 DIAGNOSIS — R7303 Prediabetes: Secondary | ICD-10-CM

## 2022-02-26 DIAGNOSIS — E782 Mixed hyperlipidemia: Secondary | ICD-10-CM | POA: Diagnosis not present

## 2022-02-26 DIAGNOSIS — Z79899 Other long term (current) drug therapy: Secondary | ICD-10-CM

## 2022-02-26 MED ORDER — OLMESARTAN-AMLODIPINE-HCTZ 20-5-12.5 MG PO TABS: 1.0000 | ORAL_TABLET | Freq: Every day | ORAL | 2 refills | Status: DC

## 2022-02-26 NOTE — Progress Notes (Signed)
Cardiology Office Note:    Date:  02/26/2022   ID:  Malik Jackson, DOB 03/17/1991, MRN [id number]  PCP:  Avanell Shackleton, NP-C   [facility] Providers Cardiologist:  None {  Referring MD: Cathren Laine, MD    History of Present Illness:    Malik Jackson is a 31 y.o. male with a hx of HTN, OSA, fatty liver disease, morbid obesity, HLD, and prediabetes who was referred by Dr. Denton Jackson for further evaluation of chest pain.  Patient was seen in the ER on 01/23/22. Note reviewed. He presented with chest pressure for 2 days. Trop negative x2. ECG with NSR with nonspecific ST-T wave changes. Given reassuring work-up, he was discharged home with CV follow-up.  Today, patient states that he is well.   Patient stated that he previously experienced pressure on his chest which he described as "like a weighted blanket." This prompted his ED visit in September where he was found to be hypertensive at 207/141. At the time, he stated he was not on any antihypertensives. He believes his chest pressure correlated with his high blood pressure. Lately, he is not having any chest pressure. He is compliant with HCTZ, olmesartan, and amlodipine and has seen improvement in his blood pressure, although he does not regularly monitor this at home.  Also he complaints of tingling sensations and slight edema on his bilateral hands, as well as some LE edema.   He had been taking visits at the weight loss clinic and saw slight improvement on his weight. At one time he felt comfortable with his health and stopped his routines and gained weight back. Admits to not adhering to a healthy diet currently.  He is currently working in Set [employer] with 7-7 night shifts. He is very active at work with walking and climbing stairs without anginal symptoms.   He denies any palpitations, or shortness of breath. No lightheadedness, headaches, syncope, orthopnea, or PND.   Past Medical History:  Diagnosis Date    Abnormal liver function tests 03/28/2019   Back pain    Edema of both lower extremities    Fatty liver    HTN (hypertension)    diagnosed 2 years ago   Morbid obesity (HCC)    OSA on CPAP     Past Surgical History:  Procedure Laterality Date   APPENDECTOMY      Current Medications: Current Meds  Medication Sig   [DISCONTINUED] Olmesartan-amLODIPine-HCTZ 20-5-12.5 MG TABS Take 1 tablet by mouth daily.     Allergies:   Patient has no known allergies.   Social History   Socioeconomic History   Marital status: Married    Spouse name: Malik Jackson   Number of children: Not on file   Years of education: Not on file   Highest education level: Not on file  Occupational History   Occupation: Press Dept Lead  Tobacco Use   Smoking status: Never   Smokeless tobacco: Never  Vaping Use   Vaping Use: Never used  Substance and Sexual Activity   Alcohol use: Yes    Comment: 8 beers per week    Drug use: No   Sexual activity: Yes    Birth control/protection: None  Other Topics Concern   Not on file  Social History Narrative   Not on file   Social Determinants of Health   Financial Resource Strain: Not on file  Food Insecurity: Not on file  Transportation Needs: Not on file  Physical Activity: Not on file  Stress: Not  on file  Social Connections: Not on file     Family History: The patient's family history includes Alcoholism in his father; Diabetes in his father; High Cholesterol in his father; Hyperlipidemia in his mother; Hypertension in his maternal aunt; Sleep apnea in his father. There is no history of Colon cancer, Stomach cancer, Pancreatic cancer, or Esophageal cancer.  ROS:   Review of Systems  Constitutional:  Negative for fever and weight loss.  HENT:  Negative for hearing loss and tinnitus.   Eyes:  Negative for discharge and redness.  Respiratory:  Negative for hemoptysis and shortness of breath.   Cardiovascular:  Positive for chest pain (Pressure; Now  resolved) and leg swelling. Negative for palpitations, orthopnea, claudication and PND.  Gastrointestinal:  Negative for abdominal pain and melena.  Genitourinary:  Negative for flank pain and frequency.  Musculoskeletal:  Negative for back pain and falls.  Neurological:  Positive for tingling (Bilateral hands). Negative for tremors and headaches.  Psychiatric/Behavioral:  Negative for hallucinations and suicidal ideas.      EKGs/Labs/Other Studies Reviewed:    The following studies were reviewed today:  CT Angio Chest/Abd/Pel 01/23/22:  FINDINGS: CTA CHEST FINDINGS   Cardiovascular: No evidence of aortic intramural hematoma. Preferential opacification of the thoracic aorta. No evidence of thoracic aortic aneurysm or dissection. Normal heart size. No pericardial effusion. No central pulmonary embolism.   Mediastinum/Nodes: No enlarged mediastinal, hilar, or axillary lymph nodes. Thyroid gland, trachea, and esophagus demonstrate no significant findings.   Lungs/Pleura: Lungs are clear. No pleural effusion or pneumothorax.   Musculoskeletal: No chest wall abnormality. No acute or significant osseous findings.   Review of the MIP images confirms the above findings.   CTA ABDOMEN AND PELVIS FINDINGS VASCULAR   Aorta: Normal caliber aorta without aneurysm, dissection, vasculitis or significant stenosis.   Celiac: Patent without evidence of aneurysm, dissection, vasculitis or significant stenosis.   SMA: Patent without evidence of aneurysm, dissection, vasculitis or significant stenosis.   Renals: Both renal arteries are patent without evidence of aneurysm, dissection, vasculitis, fibromuscular dysplasia or significant stenosis.   IMA: Patent without evidence of aneurysm, dissection, vasculitis or significant stenosis.   Inflow: Patent without evidence of aneurysm, dissection, vasculitis or significant stenosis.   Veins: No obvious venous abnormality within the  limitations of this arterial phase study.   Review of the MIP images confirms the above findings  IMPRESSION: 1. No acute aortic syndrome. No acute abnormality in the chest, abdomen, or pelvis. 2. Severe hepatic steatosis.  EKG:  EKG is personally reviewed. 02/26/2022:  EKG was not ordered. 01/23/2022 (ED):  Normal sinus rhythm at 72 bpm. Nonspecific T wave abnormality   Recent Labs: 01/23/2022: BUN 12; Creatinine, Ser 0.96; Hemoglobin 17.7; Platelets 223; Potassium 3.3; Sodium 137   Recent Lipid Panel    Component Value Date/Time   CHOL 227 (H) 03/27/2019 0841   TRIG 186 (H) 03/27/2019 0841   HDL 43 03/27/2019 0841   CHOLHDL 5.3 (H) 03/27/2019 0841   CHOLHDL 6.8 (H) 09/22/2016 0928   VLDL 73 (H) 09/22/2016 0928   LDLCALC 150 (H) 03/27/2019 0841     Risk Assessment/Calculations:            Physical Exam:    VS:  BP (!) 118/90   Pulse 95   Ht  (1.626 m)   Wt 254 lb (115.2 kg)   SpO2 97%   BMI 43.60 kg/m     Wt Readings from Last 3 Encounters:  02/26/22 254 lb (  115.2 kg)  01/23/22 250 lb (113.4 kg)  01/01/20 254 lb (115.2 kg)     GEN: Well nourished, well developed in no acute distress HEENT: Normal NECK: No JVD; No carotid bruits CARDIAC: RRR, no murmurs, rubs, gallops RESPIRATORY:  Clear to auscultation without rales, wheezing or rhonchi  ABDOMEN: Soft, non-tender, non-distended MUSCULOSKELETAL:  No edema; No deformity  SKIN: Warm and dry NEUROLOGIC:  Alert and oriented x 3 PSYCHIATRIC:  Normal affect   ASSESSMENT:    1. Precordial pain   2. Essential hypertension   3. Fatty liver   4. Mixed hyperlipidemia   5. Prediabetes   6. Other hyperlipidemia   7. Medication management   8. Morbid obesity (HCC)    PLAN:    In order of problems listed above:  #Chest Pain: Suspect this was driven by severely elevated blood pressure (200/140 in the ER). Work-up otherwise reassuring with trop negative, ECG nonischemic. CT chest without significant  coronary calcifications. Blood pressure is now much better controlled and he denies any recurrence of pain. Will check TTE to ensure no significant LVH given degree of HTN and follow-up results.  -Check TTE -BP control as below  #HTN: Much better controlled today. -Continue olmesartan-amlodipine-HCTZ 20-5-12.5mg  daily -Check BP at home and ensure at goal <130/90  #Morbid Obesity: BMI 43. Discussed diet and lifestyle change at length today. -Lifestyle modifications as below  #CV Prevention: -Check lipids and A1C -Lifestyle modifications as above  Exercise recommendations: Goal of exercising for at least 30 minutes a day, at least 5 times per week.  Please exercise to a moderate exertion.  This means that while exercising it is difficult to speak in full sentences, however you are not so short of breath that you feel you must stop, and not so comfortable that you can carry on a full conversation.  Exertion level should be approximately a 5/10, if 10 is the most exertion you can perform.  Diet recommendations: Recommend a heart healthy diet such as the Mediterranean diet.  This diet consists of plant based foods, healthy fats, lean meats, olive oil.  It suggests limiting the intake of simple carbohydrates such as white breads, pastries, and pastas.  It also limits the amount of red meat, wine, and dairy products such as cheese that one should consume on a daily basis.           Follow-up:786-months  Medication Adjustments/Labs and Tests Ordered: Current medicines are reviewed at length with the patient today.  Concerns regarding medicines are outlined above.  Orders Placed This Encounter  Procedures   Lipid Profile   HgB A1c   ECHOCARDIOGRAM COMPLETE   Meds ordered this encounter  Medications   Olmesartan-amLODIPine-HCTZ 20-5-12.5 MG TABS    Sig: Take 1 tablet by mouth daily.    Dispense:  90 tablet    Refill:  2   Patient Instructions  Medication Instructions:   Your  physician recommends that you continue on your current medications as directed. Please refer to the Current Medication list given to you today.  *If you need a refill on your cardiac medications before your next appointment, please call your pharmacy*   Lab Work:  SAME DAY AS YOUR ECHO IS SCHEDULED--CHECK LIPIDS AND A1C AT THAT TIME--PLEASE COME FASTING TO THIS LAB APPOINTMENT  If you have labs (blood work) drawn today and your tests are completely normal, you will receive your results only by: MyChart Message (if you have MyChart) OR A paper copy in the mail If  you have any lab test that is abnormal or we need to change your treatment, we will call you to review the results.   Testing/Procedures:  Your physician has requested that you have an echocardiogram. Echocardiography is a painless test that uses sound waves to create images of your heart. It provides your doctor with information about the size and shape of your heart and how well your heart's chambers and valves are working. This procedure takes approximately one hour. There are no restrictions for this procedure.  PLEASE SCHEDULE LAB APPOINTMENT SAME DAY AS THIS APPOINTMENT   Please do NOT wear cologne, perfume, aftershave, or lotions (deodorant is allowed). Please arrive 15 minutes prior to your appointment time.    Follow-Up: At [facility], you and your health needs are our priority.  As part of our continuing mission to provide you with exceptional heart care, we have created designated Provider Care Teams.  These Care Teams include your primary Cardiologist (physician) and Advanced Practice Providers (APPs -  Physician Assistants and Nurse Practitioners) who all work together to provide you with the care you need, when you need it.  We recommend signing up for the patient portal called "MyChart".  Sign up information is provided on this After Visit Summary.  MyChart is used to connect with patients for Virtual  Visits (Telemedicine).  Patients are able to view lab/test results, encounter notes, upcoming appointments, etc.  Non-urgent messages can be sent to your provider as well.   To learn more about what you can do with MyChart, go to [email or web address].    Your next appointment:   6 month(s)  The format for your next appointment:   In Person  Provider:   None  or Vin Bhagat, PA-C, Jari Favre, PA-C, Ronie Spies, PA-C, Robin Searing, NP, Nada Boozer, NP, Jacolyn Reedy, PA-C, Eligha Bridegroom, NP, or Tereso Newcomer, PA-C         Other Instructions  Mediterranean Diet A Mediterranean diet refers to food and lifestyle choices that are based on the traditions of countries located on the [place] Sea. It focuses on eating more fruits, vegetables, whole grains, beans, nuts, seeds, and heart-healthy fats, and eating less dairy, meat, eggs, and processed foods with added sugar, salt, and fat. This way of eating has been shown to help prevent certain conditions and improve outcomes for people who have chronic diseases, like kidney disease and heart disease. What are tips for following this plan? Reading food labels Check the serving size of packaged foods. For foods such as rice and pasta, the serving size refers to the amount of cooked product, not dry. Check the total fat in packaged foods. Avoid foods that have saturated fat or trans fats. Check the ingredient list for added sugars, such as corn syrup. Shopping  Buy a variety of foods that offer a balanced diet, including: Fresh fruits and vegetables (produce). Grains, beans, nuts, and seeds. Some of these may be available in unpackaged forms or large amounts (in bulk). Fresh seafood. Poultry and eggs. Low-fat dairy products. Buy whole ingredients instead of prepackaged foods. Buy fresh fruits and vegetables in-season from local farmers markets. Buy plain frozen fruits and vegetables. If you do not have access to quality fresh seafood, buy  precooked frozen shrimp or canned fish, such as tuna, salmon, or sardines. Stock your pantry so you always have certain foods on hand, such as olive oil, canned tuna, canned tomatoes, rice, pasta, and beans. Cooking Cook foods with extra-virgin olive oil instead  of using butter or other vegetable oils. Have meat as a side dish, and have vegetables or grains as your main dish. This means having meat in small portions or adding small amounts of meat to foods like pasta or stew. Use beans or vegetables instead of meat in common dishes like chili or lasagna. Experiment with different cooking methods. Try roasting, broiling, steaming, and sauting vegetables. Add frozen vegetables to soups, stews, pasta, or rice. Add nuts or seeds for added healthy fats and plant protein at each meal. You can add these to yogurt, salads, or vegetable dishes. Marinate fish or vegetables using olive oil, lemon juice, garlic, and fresh herbs. Meal planning Plan to eat one vegetarian meal one day each week. Try to work up to two vegetarian meals, if possible. Eat seafood two or more times a week. Have healthy snacks readily available, such as: Vegetable sticks with hummus. Greek yogurt. Fruit and nut trail mix. Eat balanced meals throughout the week. This includes: Fruit: 2-3 servings a day. Vegetables: 4-5 servings a day. Low-fat dairy: 2 servings a day. Fish, poultry, or lean meat: 1 serving a day. Beans and legumes: 2 or more servings a week. Nuts and seeds: 1-2 servings a day. Whole grains: 6-8 servings a day. Extra-virgin olive oil: 3-4 servings a day. Limit red meat and sweets to only a few servings a month. Lifestyle  Cook and eat meals together with your family, when possible. Drink enough fluid to keep your urine pale yellow. Be physically active every day. This includes: Aerobic exercise like running or swimming. Leisure activities like gardening, walking, or housework. Get 7-8 hours of sleep each  night. If recommended by your health care provider, drink red wine in moderation. This means 1 glass a day for nonpregnant women and 2 glasses a day for men. A glass of wine equals 5 oz (150 mL). What foods should I eat? Fruits Apples. Apricots. Avocado. Berries. Bananas. Cherries. Dates. Figs. Grapes. Lemons. Melon. Oranges. Peaches. Plums. Pomegranate. Vegetables Artichokes. Beets. Broccoli. Cabbage. Carrots. Eggplant. Green beans. Chard. Kale. Spinach. Onions. Leeks. Peas. Squash. Tomatoes. Peppers. Radishes. Grains Whole-grain pasta. Brown rice. Bulgur wheat. Polenta. Couscous. Whole-wheat bread. Orpah Cobb. Meats and other proteins Beans. Almonds. Sunflower seeds. Pine nuts. Peanuts. Cod. Salmon. Scallops. Shrimp. Tuna. Tilapia. Clams. Oysters. Eggs. Poultry without skin. Dairy Low-fat milk. Cheese. Greek yogurt. Fats and oils Extra-virgin olive oil. Avocado oil. Grapeseed oil. Beverages Water. Red wine. Herbal tea. Sweets and desserts Greek yogurt with honey. Baked apples. Poached pears. Trail mix. Seasonings and condiments Basil. Cilantro. Coriander. Cumin. Mint. Parsley. Sage. Rosemary. Tarragon. Garlic. Oregano. Thyme. Pepper. Balsamic vinegar. Tahini. Hummus. Tomato sauce. Olives. Mushrooms. The items listed above may not be a complete list of foods and beverages you can eat. Contact a dietitian for more information. What foods should I limit? This is a list of foods that should be eaten rarely or only on special occasions. Fruits Fruit canned in syrup. Vegetables Deep-fried potatoes (french fries). Grains Prepackaged pasta or rice dishes. Prepackaged cereal with added sugar. Prepackaged snacks with added sugar. Meats and other proteins Beef. Pork. Lamb. Poultry with skin. Hot dogs. Tomasa Blase. Dairy Ice cream. Sour cream. Whole milk. Fats and oils Butter. Canola oil. Vegetable oil. Beef fat (tallow). Lard. Beverages Juice. Sugar-sweetened soft drinks. Beer. Liquor and  spirits. Sweets and desserts Cookies. Cakes. Pies. Candy. Seasonings and condiments Mayonnaise. Pre-made sauces and marinades. The items listed above may not be a complete list of foods and beverages you should limit.  Contact a dietitian for more information. Summary The Mediterranean diet includes both food and lifestyle choices. Eat a variety of fresh fruits and vegetables, beans, nuts, seeds, and whole grains. Limit the amount of red meat and sweets that you eat. If recommended by your health care provider, drink red wine in moderation. This means 1 glass a day for nonpregnant women and 2 glasses a day for men. A glass of wine equals 5 oz (150 mL). This information is not intended to replace advice given to you by your health care provider. Make sure you discuss any questions you have with your health care provider. Document Revised: 06/01/2019 Document Reviewed: 03/29/2019 Elsevier Patient Education  [place] as a scribe for Freada Bergeron, MD.,have documented all relevant documentation on the behalf of Freada Bergeron, MD,as directed by  Freada Bergeron, MD while in the presence of Freada Bergeron, MD.  I, Freada Bergeron, MD, have reviewed all documentation for this visit. The documentation on 02/26/22 for the exam, diagnosis, procedures, and orders are all accurate and complete.   Signed, Freada Bergeron, MD  02/26/2022 12:08 PM    [place]

## 2022-02-26 NOTE — Patient Instructions (Signed)
Medication Instructions:   Your physician recommends that you continue on your current medications as directed. Please refer to the Current Medication list given to you today.  *If you need a refill on your cardiac medications before your next appointment, please call your pharmacy*   Lab Work:  SAME DAY AS YOUR ECHO IS SCHEDULED--CHECK LIPIDS AND A1C AT THAT TIME--PLEASE COME FASTING TO THIS LAB APPOINTMENT  If you have labs (blood work) drawn today and your tests are completely normal, you will receive your results only by: MyChart Message (if you have MyChart) OR A paper copy in the mail If you have any lab test that is abnormal or we need to change your treatment, we will call you to review the results.   Testing/Procedures:  Your physician has requested that you have an echocardiogram. Echocardiography is a painless test that uses sound waves to create images of your heart. It provides your doctor with information about the size and shape of your heart and how well your heart's chambers and valves are working. This procedure takes approximately one hour. There are no restrictions for this procedure.  PLEASE SCHEDULE LAB APPOINTMENT SAME DAY AS THIS APPOINTMENT   Please do NOT wear cologne, perfume, aftershave, or lotions (deodorant is allowed). Please arrive 15 minutes prior to your appointment time.    Follow-Up: At [facility], you and your health needs are our priority.  As part of our continuing mission to provide you with exceptional heart care, we have created designated Provider Care Teams.  These Care Teams include your primary Cardiologist (physician) and Advanced Practice Providers (APPs -  Physician Assistants and Nurse Practitioners) who all work together to provide you with the care you need, when you need it.  We recommend signing up for the patient portal called "MyChart".  Sign up information is provided on this After Visit Summary.  MyChart is used to  connect with patients for Virtual Visits (Telemedicine).  Patients are able to view lab/test results, encounter notes, upcoming appointments, etc.  Non-urgent messages can be sent to your provider as well.   To learn more about what you can do with MyChart, go to [email or web address].    Your next appointment:   6 month(s)  The format for your next appointment:   In Person  Provider:   None  or Vin Bhagat, PA-C, Jari Favre, PA-C, Ronie Spies, PA-C, Robin Searing, NP, Nada Boozer, NP, Jacolyn Reedy, PA-C, Eligha Bridegroom, NP, or Tereso Newcomer, PA-C         Other Instructions  Mediterranean Diet A Mediterranean diet refers to food and lifestyle choices that are based on the traditions of countries located on the [place]. It focuses on eating more fruits, vegetables, whole grains, beans, nuts, seeds, and heart-healthy fats, and eating less dairy, meat, eggs, and processed foods with added sugar, salt, and fat. This way of eating has been shown to help prevent certain conditions and improve outcomes for people who have chronic diseases, like kidney disease and heart disease. What are tips for following this plan? Reading food labels Check the serving size of packaged foods. For foods such as rice and pasta, the serving size refers to the amount of cooked product, not dry. Check the total fat in packaged foods. Avoid foods that have saturated fat or trans fats. Check the ingredient list for added sugars, such as corn syrup. Shopping  Buy a variety of foods that offer a balanced diet, including: Fresh fruits and vegetables (produce).  Grains, beans, nuts, and seeds. Some of these may be available in unpackaged forms or large amounts (in bulk). Fresh seafood. Poultry and eggs. Low-fat dairy products. Buy whole ingredients instead of prepackaged foods. Buy fresh fruits and vegetables in-season from local farmers markets. Buy plain frozen fruits and vegetables. If you do not have  access to quality fresh seafood, buy precooked frozen shrimp or canned fish, such as tuna, salmon, or sardines. Stock your pantry so you always have certain foods on hand, such as olive oil, canned tuna, canned tomatoes, rice, pasta, and beans. Cooking Cook foods with extra-virgin olive oil instead of using butter or other vegetable oils. Have meat as a side dish, and have vegetables or grains as your main dish. This means having meat in small portions or adding small amounts of meat to foods like pasta or stew. Use beans or vegetables instead of meat in common dishes like chili or lasagna. Experiment with different cooking methods. Try roasting, broiling, steaming, and sauting vegetables. Add frozen vegetables to soups, stews, pasta, or rice. Add nuts or seeds for added healthy fats and plant protein at each meal. You can add these to yogurt, salads, or vegetable dishes. Marinate fish or vegetables using olive oil, lemon juice, garlic, and fresh herbs. Meal planning Plan to eat one vegetarian meal one day each week. Try to work up to two vegetarian meals, if possible. Eat seafood two or more times a week. Have healthy snacks readily available, such as: Vegetable sticks with hummus. Greek yogurt. Fruit and nut trail mix. Eat balanced meals throughout the week. This includes: Fruit: 2-3 servings a day. Vegetables: 4-5 servings a day. Low-fat dairy: 2 servings a day. Fish, poultry, or lean meat: 1 serving a day. Beans and legumes: 2 or more servings a week. Nuts and seeds: 1-2 servings a day. Whole grains: 6-8 servings a day. Extra-virgin olive oil: 3-4 servings a day. Limit red meat and sweets to only a few servings a month. Lifestyle  Cook and eat meals together with your family, when possible. Drink enough fluid to keep your urine pale yellow. Be physically active every day. This includes: Aerobic exercise like running or swimming. Leisure activities like gardening, walking, or  housework. Get 7-8 hours of sleep each night. If recommended by your health care provider, drink red wine in moderation. This means 1 glass a day for nonpregnant women and 2 glasses a day for men. A glass of wine equals 5 oz (150 mL). What foods should I eat? Fruits Apples. Apricots. Avocado. Berries. Bananas. Cherries. Dates. Figs. Grapes. Lemons. Melon. Oranges. Peaches. Plums. Pomegranate. Vegetables Artichokes. Beets. Broccoli. Cabbage. Carrots. Eggplant. Green beans. Chard. Kale. Spinach. Onions. Leeks. Peas. Squash. Tomatoes. Peppers. Radishes. Grains Whole-grain pasta. Brown rice. Bulgur wheat. Polenta. Couscous. Whole-wheat bread. Orpah Cobb. Meats and other proteins Beans. Almonds. Sunflower seeds. Pine nuts. Peanuts. Cod. Salmon. Scallops. Shrimp. Tuna. Tilapia. Clams. Oysters. Eggs. Poultry without skin. Dairy Low-fat milk. Cheese. Greek yogurt. Fats and oils Extra-virgin olive oil. Avocado oil. Grapeseed oil. Beverages Water. Red wine. Herbal tea. Sweets and desserts Greek yogurt with honey. Baked apples. Poached pears. Trail mix. Seasonings and condiments Basil. Cilantro. Coriander. Cumin. Mint. Parsley. Sage. Rosemary. Tarragon. Garlic. Oregano. Thyme. Pepper. Balsamic vinegar. Tahini. Hummus. Tomato sauce. Olives. Mushrooms. The items listed above may not be a complete list of foods and beverages you can eat. Contact a dietitian for more information. What foods should I limit? This is a list of foods that should be eaten rarely  or only on special occasions. Fruits Fruit canned in syrup. Vegetables Deep-fried potatoes (french fries). Grains Prepackaged pasta or rice dishes. Prepackaged cereal with added sugar. Prepackaged snacks with added sugar. Meats and other proteins Beef. Pork. Lamb. Poultry with skin. Hot dogs. Berniece Salines. Dairy Ice cream. Sour cream. Whole milk. Fats and oils Butter. Canola oil. Vegetable oil. Beef fat (tallow). Lard. Beverages Juice.  Sugar-sweetened soft drinks. Beer. Liquor and spirits. Sweets and desserts Cookies. Cakes. Pies. Candy. Seasonings and condiments Mayonnaise. Pre-made sauces and marinades. The items listed above may not be a complete list of foods and beverages you should limit. Contact a dietitian for more information. Summary The Mediterranean diet includes both food and lifestyle choices. Eat a variety of fresh fruits and vegetables, beans, nuts, seeds, and whole grains. Limit the amount of red meat and sweets that you eat. If recommended by your health care provider, drink red wine in moderation. This means 1 glass a day for nonpregnant women and 2 glasses a day for men. A glass of wine equals 5 oz (150 mL). This information is not intended to replace advice given to you by your health care provider. Make sure you discuss any questions you have with your health care provider. Document Revised: 06/01/2019 Document Reviewed: 03/29/2019 [employer] Patient Education  [place]

## 2022-03-18 ENCOUNTER — Ambulatory Visit: Payer: BC Managed Care – PPO

## 2022-03-18 ENCOUNTER — Ambulatory Visit (HOSPITAL_COMMUNITY): Payer: BC Managed Care – PPO | Attending: Cardiology

## 2022-03-18 DIAGNOSIS — R072 Precordial pain: Secondary | ICD-10-CM | POA: Insufficient documentation

## 2022-03-18 DIAGNOSIS — K760 Fatty (change of) liver, not elsewhere classified: Secondary | ICD-10-CM | POA: Diagnosis present

## 2022-03-18 DIAGNOSIS — R7303 Prediabetes: Secondary | ICD-10-CM | POA: Diagnosis present

## 2022-03-18 DIAGNOSIS — I10 Essential (primary) hypertension: Secondary | ICD-10-CM | POA: Insufficient documentation

## 2022-03-18 DIAGNOSIS — E7849 Other hyperlipidemia: Secondary | ICD-10-CM | POA: Diagnosis present

## 2022-03-18 DIAGNOSIS — E782 Mixed hyperlipidemia: Secondary | ICD-10-CM | POA: Diagnosis present

## 2022-03-18 DIAGNOSIS — Z79899 Other long term (current) drug therapy: Secondary | ICD-10-CM | POA: Insufficient documentation

## 2022-03-18 LAB — ECHOCARDIOGRAM COMPLETE
Area-P 1/2: 3.62 cm2
S' Lateral: 3.1 cm

## 2022-03-19 LAB — HEMOGLOBIN A1C
Est. average glucose Bld gHb Est-mCnc: 126 mg/dL
Hgb A1c MFr Bld: 6 % — ABNORMAL HIGH (ref 4.8–5.6)

## 2022-03-19 LAB — LIPID PANEL
Chol/HDL Ratio: 5.6 ratio — ABNORMAL HIGH (ref 0.0–5.0)
Cholesterol, Total: 197 mg/dL (ref 100–199)
HDL: 35 mg/dL — ABNORMAL LOW (ref >39)
LDL Chol Calc (NIH): 115 mg/dL — ABNORMAL HIGH (ref 0–99)
Triglycerides: 271 mg/dL — ABNORMAL HIGH (ref 0–149)
VLDL Cholesterol Cal: 47 mg/dL — ABNORMAL HIGH (ref 5–40)

## 2022-03-24 ENCOUNTER — Encounter: Payer: Self-pay | Admitting: Internal Medicine

## 2022-12-07 ENCOUNTER — Other Ambulatory Visit: Payer: Self-pay

## 2022-12-07 DIAGNOSIS — K760 Fatty (change of) liver, not elsewhere classified: Secondary | ICD-10-CM

## 2022-12-07 DIAGNOSIS — R7303 Prediabetes: Secondary | ICD-10-CM

## 2022-12-07 DIAGNOSIS — E782 Mixed hyperlipidemia: Secondary | ICD-10-CM

## 2022-12-07 DIAGNOSIS — I10 Essential (primary) hypertension: Secondary | ICD-10-CM

## 2022-12-07 DIAGNOSIS — R072 Precordial pain: Secondary | ICD-10-CM

## 2022-12-07 DIAGNOSIS — E7849 Other hyperlipidemia: Secondary | ICD-10-CM

## 2022-12-07 DIAGNOSIS — Z79899 Other long term (current) drug therapy: Secondary | ICD-10-CM

## 2022-12-07 MED ORDER — OLMESARTAN-AMLODIPINE-HCTZ 20-5-12.5 MG PO TABS: 1.0000 | ORAL_TABLET | Freq: Every day | ORAL | 0 refills | Status: DC

## 2023-03-11 ENCOUNTER — Other Ambulatory Visit: Payer: Self-pay

## 2023-03-11 DIAGNOSIS — R7303 Prediabetes: Secondary | ICD-10-CM

## 2023-03-11 DIAGNOSIS — K760 Fatty (change of) liver, not elsewhere classified: Secondary | ICD-10-CM

## 2023-03-11 DIAGNOSIS — E782 Mixed hyperlipidemia: Secondary | ICD-10-CM

## 2023-03-11 DIAGNOSIS — R072 Precordial pain: Secondary | ICD-10-CM

## 2023-03-11 DIAGNOSIS — I10 Essential (primary) hypertension: Secondary | ICD-10-CM

## 2023-03-11 DIAGNOSIS — Z79899 Other long term (current) drug therapy: Secondary | ICD-10-CM

## 2023-03-11 DIAGNOSIS — E7849 Other hyperlipidemia: Secondary | ICD-10-CM

## 2023-03-11 MED ORDER — OLMESARTAN-AMLODIPINE-HCTZ 20-5-12.5 MG PO TABS: 1.0000 | ORAL_TABLET | Freq: Every day | ORAL | 0 refills | Status: AC

## 2023-09-02 ENCOUNTER — Other Ambulatory Visit: Payer: Self-pay | Admitting: Physician Assistant

## 2023-09-02 DIAGNOSIS — I10 Essential (primary) hypertension: Secondary | ICD-10-CM

## 2023-09-02 DIAGNOSIS — E7849 Other hyperlipidemia: Secondary | ICD-10-CM

## 2023-09-02 DIAGNOSIS — R072 Precordial pain: Secondary | ICD-10-CM

## 2023-09-02 DIAGNOSIS — R7303 Prediabetes: Secondary | ICD-10-CM

## 2023-09-02 DIAGNOSIS — Z79899 Other long term (current) drug therapy: Secondary | ICD-10-CM

## 2023-09-02 DIAGNOSIS — K760 Fatty (change of) liver, not elsewhere classified: Secondary | ICD-10-CM

## 2023-09-02 DIAGNOSIS — E782 Mixed hyperlipidemia: Secondary | ICD-10-CM
